# Patient Record
Sex: Female | Born: 1990 | Hispanic: No | Smoking: Never smoker
Health system: Southern US, Community
[De-identification: ages and names within clinical notes are randomized; demographics above are authoritative.]

## PROBLEM LIST (undated history)

## (undated) DIAGNOSIS — E282 Polycystic ovarian syndrome: Secondary | ICD-10-CM

## (undated) DIAGNOSIS — N200 Calculus of kidney: Secondary | ICD-10-CM

## (undated) HISTORY — PX: OTHER SURGICAL HISTORY: SHX169

## (undated) HISTORY — PX: APPENDECTOMY: SHX54

## (undated) HISTORY — DX: Polycystic ovarian syndrome: E28.2

## (undated) HISTORY — DX: Calculus of kidney: N20.0

## (undated) HISTORY — PX: RHINOPLASTY: SUR1284

---

## 2010-09-16 HISTORY — PX: APPENDECTOMY: SHX54

## 2017-07-11 ENCOUNTER — Emergency Department (INDEPENDENT_AMBULATORY_CARE_PROVIDER_SITE_OTHER)
Admission: EM | Admit: 2017-07-11 | Discharge: 2017-07-11 | Disposition: A | Discharge status: Home / Self Care | Payer: 59 | Source: Home / Self Care | Attending: Family Medicine | Admitting: Family Medicine

## 2017-07-11 ENCOUNTER — Encounter: Payer: Self-pay | Admitting: Emergency Medicine

## 2017-07-11 ENCOUNTER — Emergency Department (INDEPENDENT_AMBULATORY_CARE_PROVIDER_SITE_OTHER): Payer: 59

## 2017-07-11 DIAGNOSIS — R053 Chronic cough: Secondary | ICD-10-CM

## 2017-07-11 DIAGNOSIS — R05 Cough: Secondary | ICD-10-CM

## 2017-07-11 DIAGNOSIS — R0989 Other specified symptoms and signs involving the circulatory and respiratory systems: Secondary | ICD-10-CM

## 2017-07-11 DIAGNOSIS — J029 Acute pharyngitis, unspecified: Secondary | ICD-10-CM

## 2017-07-11 MED ORDER — BENZONATATE 100 MG PO CAPS: 100.0000 mg | ORAL_CAPSULE | Freq: Three times a day (TID) | ORAL | 0 refills | Status: DC

## 2017-07-11 MED ORDER — AZITHROMYCIN 250 MG PO TABS: 250.0000 mg | ORAL_TABLET | Freq: Every day | ORAL | 0 refills | Status: DC

## 2017-07-11 MED ORDER — IPRATROPIUM BROMIDE 0.06 % NA SOLN: 2.0000 | Freq: Four times a day (QID) | NASAL | 1 refills | Status: DC

## 2017-07-11 NOTE — ED Triage Notes (Signed)
Dry cough x 3 weeks, feels like something is stuck in my throat. Had fever of 103 for 2 days about 1 week ago.

## 2017-07-11 NOTE — ED Provider Notes (Signed)
[facility]    CSN: [id number] Arrival date & time: 07/11/17  0901     History   Chief Complaint Chief Complaint  Patient presents with  . Cough    HPI Angel [name] is a 26 y.o. female.   HPI  Angel [name] is a 26 y.o. female presenting to UC with c/o dry nagging cough that started about 3 weeks ago with fever Tmax 103*F the first week.  She reports several episodes of post-tussive vomiting of clear fluid.  She also reports foreign body sensation in her throat.  She has tried Dayquil/Nyquil w/o relief.  Denies fever this week.  Denies nausea or diarrhea. No known sick contacts. No hx of asthma.    History reviewed. No pertinent past medical history.  There are no active problems to display for this patient.   Past Surgical History:  Procedure Laterality Date  . APPENDECTOMY    . Kidney stone removal      OB History    No data available       Home Medications    Prior to Admission medications   Medication Sig Start Date End Date Taking? Authorizing Provider  Pseudoeph-Doxylamine-DM-APAP (DAYQUIL/NYQUIL COLD/FLU RELIEF PO) Take by mouth.   Yes [provider]  azithromycin (ZITHROMAX) 250 MG tablet Take 1 tablet (250 mg total) by mouth daily. Take first 2 tablets together, then 1 every day until finished. 07/11/17   Lurene Shadow, PA-C  benzonatate (TESSALON) 100 MG capsule Take 1-2 capsules (100-200 mg total) by mouth every 8 (eight) hours. 07/11/17   Lurene Shadow, PA-C  ipratropium (ATROVENT) 0.06 % nasal spray Place 2 sprays into both nostrils 4 (four) times daily. 07/11/17   Lurene Shadow, PA-C    Family History Family History  Problem Relation Age of Onset  . Kidney Stones Mother   . Kidney Stones Father     Social History Social History  Substance Use Topics  . Smoking status: Never Smoker  . Smokeless tobacco: Never Used  . Alcohol use No     Allergies   Levaquin [levofloxacin in d5w]   Review of Systems Review of  Systems  Constitutional: Negative for chills and fever.  HENT: Positive for congestion and sore throat. Negative for ear pain, trouble swallowing and voice change.   Respiratory: Positive for cough. Negative for shortness of breath.   Cardiovascular: Negative for chest pain and palpitations.  Gastrointestinal: Positive for vomiting ( post-tussive). Negative for abdominal pain, diarrhea and nausea.  Musculoskeletal: Negative for arthralgias, back pain and myalgias.  Skin: Negative for rash.  Neurological: Negative for dizziness, light-headedness and headaches.     Physical Exam Triage Vital Signs ED Triage Vitals  Enc Vitals Group     BP 07/11/17 0928 114/74     Pulse Rate 07/11/17 0928 98     Resp --      Temp 07/11/17 0928 98.3 F (36.8 C)     Temp Source 07/11/17 0928 Oral     SpO2 07/11/17 0928 100 %     Weight 07/11/17 0929 92 lb (41.7 kg)     Height 07/11/17 0929 5\' 1"  (1.549 m)     Head Circumference --      Peak Flow --      Pain Score 07/11/17 0929 0     Pain Loc --      Pain Edu? --      Excl. in GC? --    No data found.   Updated  Vital Signs BP 114/74 (BP Location: Left Arm)   Pulse 98   Temp 98.3 F (36.8 C) (Oral)   Ht 5\' 1"  (1.549 m)   Wt 92 lb (41.7 kg)   LMP 06/11/2017 (Exact Date)   SpO2 100%   BMI 17.38 kg/m   Visual Acuity Right Eye Distance:   Left Eye Distance:   Bilateral Distance:    Right Eye Near:   Left Eye Near:    Bilateral Near:     Physical Exam  Constitutional: She is oriented to person, place, and time. She appears well-developed and well-nourished. No distress.  HENT:  Head: Normocephalic and atraumatic.  Eyes: EOM are normal.  Neck: Normal range of motion. Neck supple.  Cardiovascular: Normal rate and regular rhythm.   Pulmonary/Chest: Effort normal and breath sounds normal. No stridor. No respiratory distress. She has no wheezes. She has no rales.  Musculoskeletal: Normal range of motion.  Lymphadenopathy:    She has  no cervical adenopathy.  Neurological: She is alert and oriented to person, place, and time.  Skin: Skin is warm and dry. She is not diaphoretic.  Psychiatric: She has a normal mood and affect. Her behavior is normal.  Nursing note and vitals reviewed.    UC Treatments / Results  Labs (all labs ordered are listed, but only abnormal results are displayed) Labs Reviewed - No data to display  EKG  EKG Interpretation None       Radiology Dg Neck Soft Tissue  Result Date: 07/11/2017 CLINICAL DATA:  Cough for 3 weeks.  Sore throat. EXAM: NECK SOFT TISSUES - 1+ VIEW COMPARISON:  None. FINDINGS: There is no evidence of retropharyngeal soft tissue swelling or epiglottic enlargement. The cervical airway is unremarkable and no radio-opaque foreign body identified. No acute osseous abnormality. IMPRESSION: Negative. Electronically Signed   By: Elige KoHetal  Patel   On: 07/11/2017 10:14   Dg Chest 2 View  Result Date: 07/11/2017 CLINICAL DATA:  Cough for 3 weeks EXAM: CHEST  2 VIEW COMPARISON:  None. FINDINGS: Lungs are clear. Heart size and pulmonary vascularity are normal. No adenopathy. No bone lesions. IMPRESSION: No edema or consolidation. Electronically Signed   By: Bretta BangWilliam  Woodruff III M.D.   On: 07/11/2017 10:14    Procedures Procedures (including critical care time)  Medications Ordered in UC Medications - No data to display   Initial Impression / Assessment and Plan / UC Course  I have reviewed the triage vital signs and the nursing notes.  Pertinent labs & imaging results that were available during my care of the patient were reviewed by me and considered in my medical decision making (see chart for details).     Pt c/o persistent non-productive cough that started with fever Tmax 103*F.  Vitals today: WNL including O2 Sat 100% on RA  Imaging: WNL Will cover for atypical bacteria with Azithromycin And treat symptomatically with ipratropium nasal spray and tessalon Throat  symptoms could be due to irritation from cough, post-nasal drip, and/or acid reflux.  F/u with PCP in 1 week if not improving.    Final Clinical Impressions(s) / UC Diagnoses   Final diagnoses:  Sensation of foreign body in throat  Persistent cough for 3 weeks or longer  Sore throat    New Prescriptions Discharge Medication List as of 07/11/2017 10:25 AM    START taking these medications   Details  azithromycin (ZITHROMAX) 250 MG tablet Take 1 tablet (250 mg total) by mouth daily. Take first 2 tablets together,  then 1 every day until finished., Starting Fri 07/11/2017, Normal    benzonatate (TESSALON) 100 MG capsule Take 1-2 capsules (100-200 mg total) by mouth every 8 (eight) hours., Starting Fri 07/11/2017, Normal    ipratropium (ATROVENT) 0.06 % nasal spray Place 2 sprays into both nostrils 4 (four) times daily., Starting Fri 07/11/2017, Normal         Controlled Substance Prescriptions [place] Controlled Substance Registry consulted? Not Applicable   Lurene Shadow, PA-C 07/11/17 1037

## 2019-09-17 HISTORY — PX: OTHER SURGICAL HISTORY: SHX169

## 2021-08-13 ENCOUNTER — Other Ambulatory Visit: Payer: Self-pay

## 2021-08-14 ENCOUNTER — Encounter: Payer: Self-pay | Admitting: Family Medicine

## 2021-08-14 ENCOUNTER — Ambulatory Visit (INDEPENDENT_AMBULATORY_CARE_PROVIDER_SITE_OTHER): Payer: 59 | Admitting: Family Medicine

## 2021-08-14 VITALS — BP 100/60 | HR 96 | Temp 98.0°F | Resp 16 | Ht 61.0 in | Wt 98.8 lb

## 2021-08-14 DIAGNOSIS — Z0000 Encounter for general adult medical examination without abnormal findings: Secondary | ICD-10-CM | POA: Diagnosis not present

## 2021-08-14 DIAGNOSIS — E282 Polycystic ovarian syndrome: Secondary | ICD-10-CM | POA: Diagnosis not present

## 2021-08-14 DIAGNOSIS — Z3A01 Less than 8 weeks gestation of pregnancy: Secondary | ICD-10-CM | POA: Diagnosis not present

## 2021-08-14 NOTE — Patient Instructions (Signed)
Eating Plan for Pregnant Women °While you are pregnant, your body requires additional nutrition to help support your growing baby. You also have a higher need for some vitamins and minerals, such as folic acid, calcium, iron, and vitamin D. Eating a healthy, well-balanced diet is very important for your health and your baby's health. Your need for extra calories varies over the course of your pregnancy. Pregnancy is divided into three trimesters, with each trimester lasting 3 months. For most women, it is recommended to consume: °150 extra calories a day during the first trimester. °300 extra calories a day during the second trimester. °300 extra calories a day during the third trimester. °What are tips for following this plan? °Cooking °Practice good food safety and cleanliness. Wash your hands before you eat and after you prepare raw meat. Wash all fruits and vegetables well before peeling or eating. Taking these actions can help to prevent foodborne illnesses that can be very dangerous to your baby, such as listeriosis. Ask your health care provider for more information about listeriosis. °Make sure that all meats, poultry, and eggs are cooked to food-safe temperatures or "well-done." °Meal planning ° °Eat a variety of foods (especially fruits and vegetables) to get a full range of vitamins and minerals. °Two or more servings of fish are recommended each week in order to get the most benefits from omega-3 fatty acids that are found in seafood. Choose fish that are lower in mercury, such as salmon and pollock. °Limit your overall intake of foods that have "empty calories." These are foods that have little nutritional value, such as sweets, desserts, candies, and sugar-sweetened beverages. °Drinks that contain caffeine are okay to drink, but it is better to avoid caffeine. Keep your total caffeine intake to less than 200 mg each day (which is 12 oz or 355 mL of coffee, tea, or soda) or the limit as told by your  health care provider. °General information °Do not try to lose weight or go on a diet during pregnancy. °Take a prenatal vitamin to help meet your additional vitamin and mineral needs during pregnancy, specifically for folic acid, iron, calcium, and vitamin D. °Remember to stay active. Ask your health care provider what types of exercise and activities are safe for you. °What does 150 extra calories look like? °Healthy options that provide 150 extra calories each day could be any of the following: °6-8 oz (170-227 g) plain low-fat yogurt with ½ cup (70 g) berries. °1 apple with 2 tsp (11 g) peanut butter. °Cut-up vegetables with ¼ cup (60 g) hummus. °8 fl oz (237 mL) low-fat chocolate milk. °1 stick of string cheese with 1 medium orange. °1 peanut butter and jelly sandwich that is made with one slice of whole-wheat bread and 1 tsp (5 g) of peanut butter. °For 300 extra calories, you could eat two of these healthy options each day. °What is a healthy amount of weight to gain? °The right amount of weight gain for you is based on your BMI (body mass index) before you became pregnant. °If your BMI was less than 18 (underweight), you should gain 28-40 lb (13-18 kg). °If your BMI was 18-24.9 (normal), you should gain 25-35 lb (11-16 kg). °If your BMI was 25-29.9 (overweight), you should gain 15-25 lb (7-11 kg). °If your BMI was 30 or greater (obese), you should gain 11-20 lb (5-9 kg). °What if I am having twins or multiples? °Generally, if you are carrying twins or multiples: °You may need to eat   300-600 extra calories a day. °The recommended range for total weight gain is 25-54 lb (11-25 kg), depending on your BMI before pregnancy. °Talk with your health care provider to find out about nutritional needs, weight gain, and exercise that is right for you. °What foods should I eat? °Fruits °All fruits. Eat a variety of colors and types of fruit. Remember to wash your fruits well before peeling or eating. °Vegetables °All  vegetables. Eat a variety of colors and types of vegetables. Remember to wash your vegetables well before peeling or eating. °Grains °All grains. Choose whole grains, such as whole-wheat bread, oatmeal, or brown rice. °Meats and other protein foods °Lean meats, including chicken, turkey, and lean cuts of beef, veal, or pork. Fish that is higher in omega-3 fatty acids and lower in mercury, such as salmon, herring, mussels, trout, sardines, pollock, shrimp, crab, and lobster. Tofu. Tempeh. Beans. Eggs. Peanut butter and other nut butters. °Dairy °Pasteurized milk and milk alternatives, such as almond milk. Pasteurized yogurt and pasteurized cheese. Cottage cheese. Sour cream. °Beverages °Water. Juices that contain 100% fruit juice or vegetable juice. Caffeine-free teas and decaffeinated coffee. °Fats and oils °Fats and oils are okay to include in moderation. °Sweets and desserts °Sweets and desserts are okay to include in moderation. °Seasoning and other foods °All pasteurized condiments. °The items listed above may not be a complete list of foods and beverages you can eat. Contact a dietitian for more information. °What foods should I avoid? °Fruits °Raw (unpasteurized) fruit juices. °Vegetables °Unpasteurized vegetable juices. °Meats and other protein foods °Precooked or cured meat, such as bologna, hot dogs, sausages, or meat loaves. (If you must eat those meats, reheat them until they are steaming hot.) Refrigerated pate, meat spreads from a meat counter, or smoked seafood that is found in the refrigerated section of a store. Raw or undercooked meats, poultry, and eggs. Raw fish, such as sushi or sashimi. Fish that have high mercury content, such as tilefish, shark, swordfish, and king mackerel. °Dairy °Unpasteurized milk and any foods that have unpasteurized milk in them. Soft cheeses, such as feta, queso blanco, queso fresco, Brie, Camembert, panela, and blue-veined cheeses (unless they are made with pasteurized  milk, which must be stated on the label). °Beverages °Alcohol. Sugar-sweetened beverages, such as sodas, teas, or energy drinks. °Seasoning and other foods °Homemade fermented foods and drinks, such as pickles, sauerkraut, or kombucha drinks. (Store-bought pasteurized versions of these are okay.) °Salads that are made in a store or deli, such as ham salad, chicken salad, egg salad, tuna salad, and seafood salad. °The items listed above may not be a complete list of foods and beverages you should avoid. Contact a dietitian for more information. °Where to find more information °To calculate the number of calories you need based on your height, weight, and activity level, you can use an online calculator such as: °www.myplate.gov/myplate-plan °To calculate how much weight you should gain during pregnancy, you can use an online pregnancy weight gain calculator such as: °www.myplate.gov °To learn more about eating fish during pregnancy, talk with your health care provider or visit: °www.fda.gov °Summary °While you are pregnant, your body requires additional nutrition to help support your growing baby. °Eat a variety of foods, especially fruits and vegetables, to get a full range of vitamins and minerals. °Practice good food safety and cleanliness. Wash your hands before you eat and after you prepare raw meat. Wash all fruits and vegetables well before peeling or eating. Taking these actions can help   to prevent foodborne illnesses, such as listeriosis, that can be very dangerous to your baby. °Do not eat raw meat or fish. Do not eat fish that have high mercury content, such as tilefish, shark, swordfish, and king mackerel. Do not eat raw (unpasteurized) dairy. °Take a prenatal vitamin to help meet your additional vitamin and mineral needs during pregnancy, specifically for folic acid, iron, calcium, and vitamin D. °This information is not intended to replace advice given to you by your health care provider. Make sure you  discuss any questions you have with your health care provider. °Document Revised: 03/30/2020 Document Reviewed: 03/30/2020 °Elsevier Patient Education © 2022 [employer] ° °

## 2021-08-14 NOTE — Progress Notes (Signed)
Established Patient Office Visit  Subjective:  Patient ID: Angel [name], female    DOB: Jan 21, 1991  Age: 30 y.o. MRN: [id number]  CC:  Chief Complaint  Patient presents with   New Patient (Initial Visit)    Pt states she is [redacted] weeks pregnant today. Pt states having hx of kidney stones and is concerned about her kidneys. Pt states first pregnancy.     HPI Angel [name] presents to establish.  She is 7 weeks present and would like a referral to ob.  No complaints  Her husband accompanies her today.    Past Medical History:  Diagnosis Date   Kidney stones    PCOS (polycystic ovarian syndrome)     Past Surgical History:  Procedure Laterality Date   APPENDECTOMY  2012   in [place]   Kidney stone removal  2021   and 2014    Family History  Problem Relation Age of Onset   Hypertension Mother    Kidney Stones Mother    Hypertension Father    Kidney Stones Father    Thyroid disease Sister    Seizures Sister    Polycystic ovary syndrome Sister     Social History   Socioeconomic History   Marital status: Married    Spouse name: Not on file   Number of children: Not on file   Years of education: Not on file   Highest education level: Not on file  Occupational History   Not on file  Tobacco Use   Smoking status: Never   Smokeless tobacco: Never  Substance and Sexual Activity   Alcohol use: No   Drug use: No   Sexual activity: Yes  Other Topics Concern   Not on file  Social History Narrative   Not on file   Social Determinants of Health   Financial Resource Strain: Not on file  Food Insecurity: Not on file  Transportation Needs: Not on file  Physical Activity: Not on file  Stress: Not on file  Social Connections: Not on file  Intimate Partner Violence: Not on file    Outpatient Medications Prior to Visit  Medication Sig Dispense Refill   Prenatal Vit-Fe Fumarate-FA (PRENATAL MULTIVITAMIN) TABS tablet Take 1 tablet by mouth daily at 12 noon.      azithromycin (ZITHROMAX) 250 MG tablet Take 1 tablet (250 mg total) by mouth daily. Take first 2 tablets together, then 1 every day until finished. 6 tablet 0   benzonatate (TESSALON) 100 MG capsule Take 1-2 capsules (100-200 mg total) by mouth every 8 (eight) hours. 21 capsule 0   ipratropium (ATROVENT) 0.06 % nasal spray Place 2 sprays into both nostrils 4 (four) times daily. (Patient not taking: Reported on 08/14/2021) 15 mL 1   Pseudoeph-Doxylamine-DM-APAP (DAYQUIL/NYQUIL COLD/FLU RELIEF PO) Take by mouth. (Patient not taking: Reported on 08/14/2021)     No facility-administered medications prior to visit.    Allergies  Allergen Reactions   Levaquin [Levofloxacin In D5w] Hives    ROS Review of Systems  Constitutional:  Negative for appetite change, diaphoresis, fatigue and unexpected weight change.  Eyes:  Negative for pain, redness and visual disturbance.  Respiratory:  Negative for cough, chest tightness, shortness of breath and wheezing.   Cardiovascular:  Negative for chest pain, palpitations and leg swelling.  Endocrine: Negative for cold intolerance, heat intolerance, polydipsia, polyphagia and polyuria.  Genitourinary:  Negative for difficulty urinating, dysuria and frequency.  Neurological:  Negative for dizziness, light-headedness, numbness and headaches.  Objective:    Physical Exam Vitals and nursing note reviewed.  Constitutional:      General: She is not in acute distress.    Appearance: She is well-developed. She is not diaphoretic.  HENT:     Head: Normocephalic and atraumatic.     Right Ear: External ear normal.     Left Ear: External ear normal.     Nose: Nose normal.  Eyes:     General:        Right eye: No discharge.        Left eye: No discharge.     Conjunctiva/sclera: Conjunctivae normal.     Pupils: Pupils are equal, round, and reactive to light.  Neck:     Thyroid: No thyromegaly.     Vascular: No JVD.  Cardiovascular:     Rate and Rhythm:  Normal rate and regular rhythm.     Heart sounds: Normal heart sounds. No murmur heard. Pulmonary:     Effort: Pulmonary effort is normal. No respiratory distress.     Breath sounds: Normal breath sounds. No wheezing or rales.  Chest:     Chest wall: No tenderness.  Abdominal:     General: Bowel sounds are normal. There is no distension.     Palpations: Abdomen is soft. There is no mass.     Tenderness: There is no abdominal tenderness. There is no guarding or rebound.  Genitourinary:    Vagina: Normal. No vaginal discharge.     Rectum: Guaiac result negative.  Musculoskeletal:        General: No tenderness. Normal range of motion.     Cervical back: Normal range of motion and neck supple.  Lymphadenopathy:     Cervical: No cervical adenopathy.  Skin:    General: Skin is warm and dry.     Findings: No erythema or rash.  Neurological:     Mental Status: She is alert and oriented to person, place, and time.     Cranial Nerves: No cranial nerve deficit.     Deep Tendon Reflexes: Reflexes are normal and symmetric.  Psychiatric:        Behavior: Behavior normal.        Thought Content: Thought content normal.        Judgment: Judgment normal.    BP 100/60 (BP Location: Left Arm, Patient Position: Sitting, Cuff Size: Normal)   Pulse 96   Temp 98 F (36.7 C) (Oral)   Resp 16   Ht 5\' 1"  (1.549 m)   Wt 98 lb 12.8 oz (44.8 kg)   LMP 06/26/2021   SpO2 100%   BMI 18.67 kg/m  Wt Readings from Last 3 Encounters:  08/14/21 98 lb 12.8 oz (44.8 kg)  07/11/17 92 lb (41.7 kg)     Health Maintenance Due  Topic Date Due   COVID-19 Vaccine (1) Never done   HIV Screening  Never done   Hepatitis C Screening  Never done   TETANUS/TDAP  Never done   PAP SMEAR-Modifier  Never done   INFLUENZA VACCINE  Never done    There are no preventive care reminders to display for this patient.  Lab Results  Component Value Date   TSH 2.20 08/14/2021   Lab Results  Component Value Date    WBC 7.3 08/14/2021   HGB 10.7 (L) 08/14/2021   HCT 32.8 (L) 08/14/2021   MCV 90.1 08/14/2021   PLT 225.0 08/14/2021   Lab Results  Component Value Date   NA  136 08/14/2021   K 4.4 08/14/2021   CO2 23 08/14/2021   GLUCOSE 72 08/14/2021   BUN 8 08/14/2021   CREATININE 0.48 08/14/2021   BILITOT 0.3 08/14/2021   ALKPHOS 45 08/14/2021   AST 19 08/14/2021   ALT 19 08/14/2021   PROT 6.9 08/14/2021   ALBUMIN 4.3 08/14/2021   CALCIUM 8.9 08/14/2021   GFR 126.26 08/14/2021   Lab Results  Component Value Date   CHOL 101 08/14/2021   Lab Results  Component Value Date   HDL 45.50 08/14/2021   Lab Results  Component Value Date   LDLCALC 43 08/14/2021   Lab Results  Component Value Date   TRIG 64.0 08/14/2021   Lab Results  Component Value Date   CHOLHDL 2 08/14/2021   No results found for: HGBA1C    Assessment & Plan:   Problem List Items Addressed This Visit       Unprioritized   Less than [redacted] weeks gestation of pregnancy    con't pnv ega [redacted] weeks today edc July 18,2023       Relevant Orders   Ambulatory referral to Obstetrics / Gynecology   Other Visit Diagnoses     Preventative health care    -  Primary   Relevant Orders   CBC with Differential/Platelet (Completed)   Lipid panel (Completed)   TSH (Completed)   Comprehensive metabolic panel (Completed)   Comprehensive metabolic panel   PCOS (polycystic ovarian syndrome)       Relevant Orders   Ambulatory referral to Obstetrics / Gynecology   Testos,Total,Free and SHBG (Female)       No orders of the defined types were placed in this encounter.   Follow-up: Return if symptoms worsen or fail to improve.    Ann Held, DO

## 2021-08-14 NOTE — Assessment & Plan Note (Signed)
con't pnv ega [redacted] weeks today edc July 18,2023

## 2021-08-15 LAB — COMPREHENSIVE METABOLIC PANEL
ALT: 19 U/L (ref 0–35)
AST: 19 U/L (ref 0–37)
Albumin: 4.3 g/dL (ref 3.5–5.2)
Alkaline Phosphatase: 45 U/L (ref 39–117)
BUN: 8 mg/dL (ref 6–23)
CO2: 23 mEq/L (ref 19–32)
Calcium: 8.9 mg/dL (ref 8.4–10.5)
Chloride: 105 mEq/L (ref 96–112)
Creatinine, Ser: 0.48 mg/dL (ref 0.40–1.20)
GFR: 126.26 mL/min (ref >60.00)
Glucose, Bld: 72 mg/dL (ref 70–99)
Potassium: 4.4 mEq/L (ref 3.5–5.1)
Sodium: 136 mEq/L (ref 135–145)
Total Bilirubin: 0.3 mg/dL (ref 0.2–1.2)
Total Protein: 6.9 g/dL (ref 6.0–8.3)

## 2021-08-15 LAB — CBC WITH DIFFERENTIAL/PLATELET
Basophils Absolute: 0 10*3/uL (ref 0.0–0.1)
Basophils Relative: 0.6 % (ref 0.0–3.0)
Eosinophils Absolute: 0.1 10*3/uL (ref 0.0–0.7)
Eosinophils Relative: 0.7 % (ref 0.0–5.0)
HCT: 32.8 % — ABNORMAL LOW (ref 36.0–46.0)
Hemoglobin: 10.7 g/dL — ABNORMAL LOW (ref 12.0–15.0)
Lymphocytes Relative: 22.8 % (ref 12.0–46.0)
Lymphs Abs: 1.7 10*3/uL (ref 0.7–4.0)
MCHC: 32.7 g/dL (ref 30.0–36.0)
MCV: 90.1 fl (ref 78.0–100.0)
Monocytes Absolute: 0.5 10*3/uL (ref 0.1–1.0)
Monocytes Relative: 6.7 % (ref 3.0–12.0)
Neutro Abs: 5.1 10*3/uL (ref 1.4–7.7)
Neutrophils Relative %: 69.2 % (ref 43.0–77.0)
Platelets: 225 10*3/uL (ref 150.0–400.0)
RBC: 3.64 Mil/uL — ABNORMAL LOW (ref 3.87–5.11)
RDW: 14.2 % (ref 11.5–15.5)
WBC: 7.3 10*3/uL (ref 4.0–10.5)

## 2021-08-15 LAB — LIPID PANEL
Cholesterol: 101 mg/dL (ref 0–200)
HDL: 45.5 mg/dL (ref >39.00)
LDL Cholesterol: 43 mg/dL (ref 0–99)
NonHDL: 55.61
Total CHOL/HDL Ratio: 2
Triglycerides: 64 mg/dL (ref 0.0–149.0)
VLDL: 12.8 mg/dL (ref 0.0–40.0)

## 2021-08-15 LAB — TSH: TSH: 2.2 u[IU]/mL (ref 0.35–5.50)

## 2021-08-16 ENCOUNTER — Other Ambulatory Visit: Payer: Self-pay | Admitting: Family Medicine

## 2021-08-16 ENCOUNTER — Ambulatory Visit (HOSPITAL_BASED_OUTPATIENT_CLINIC_OR_DEPARTMENT_OTHER)
Admission: RE | Admit: 2021-08-16 | Discharge: 2021-08-16 | Disposition: A | Discharge status: Home / Self Care | Payer: 59 | Source: Ambulatory Visit | Attending: Family Medicine | Admitting: Family Medicine

## 2021-08-16 ENCOUNTER — Other Ambulatory Visit: Payer: Self-pay

## 2021-08-16 DIAGNOSIS — Z0000 Encounter for general adult medical examination without abnormal findings: Secondary | ICD-10-CM | POA: Insufficient documentation

## 2021-08-16 DIAGNOSIS — Z3A01 Less than 8 weeks gestation of pregnancy: Secondary | ICD-10-CM | POA: Diagnosis present

## 2021-08-16 DIAGNOSIS — Z3491 Encounter for supervision of normal pregnancy, unspecified, first trimester: Secondary | ICD-10-CM | POA: Diagnosis not present

## 2021-08-16 NOTE — Assessment & Plan Note (Signed)
ghm utd Check labs see avs  

## 2021-08-19 LAB — TESTOS,TOTAL,FREE AND SHBG (FEMALE)
Free Testosterone: 2.5 pg/mL (ref 0.1–6.4)
Sex Hormone Binding: 149 nmol/L — ABNORMAL HIGH (ref 17–124)
Testosterone, Total, LC-MS-MS: 42 ng/dL (ref 2–45)

## 2021-08-20 ENCOUNTER — Encounter (HOSPITAL_COMMUNITY): Payer: Self-pay | Admitting: Family Medicine

## 2021-08-20 ENCOUNTER — Inpatient Hospital Stay (HOSPITAL_COMMUNITY)
Admission: AD | Admit: 2021-08-20 | Discharge: 2021-08-20 | Disposition: A | Discharge status: Home / Self Care | Payer: 59 | Attending: Family Medicine | Admitting: Family Medicine

## 2021-08-20 ENCOUNTER — Encounter: Payer: Self-pay | Admitting: Family Medicine

## 2021-08-20 ENCOUNTER — Other Ambulatory Visit: Payer: Self-pay

## 2021-08-20 DIAGNOSIS — O26891 Other specified pregnancy related conditions, first trimester: Secondary | ICD-10-CM | POA: Insufficient documentation

## 2021-08-20 DIAGNOSIS — Z3A01 Less than 8 weeks gestation of pregnancy: Secondary | ICD-10-CM | POA: Insufficient documentation

## 2021-08-20 DIAGNOSIS — O210 Mild hyperemesis gravidarum: Secondary | ICD-10-CM | POA: Diagnosis not present

## 2021-08-20 DIAGNOSIS — Z20822 Contact with and (suspected) exposure to covid-19: Secondary | ICD-10-CM | POA: Diagnosis not present

## 2021-08-20 DIAGNOSIS — R059 Cough, unspecified: Secondary | ICD-10-CM | POA: Insufficient documentation

## 2021-08-20 DIAGNOSIS — R109 Unspecified abdominal pain: Secondary | ICD-10-CM | POA: Diagnosis not present

## 2021-08-20 LAB — CBC
HCT: 35.6 % — ABNORMAL LOW (ref 36.0–46.0)
Hemoglobin: 11.8 g/dL — ABNORMAL LOW (ref 12.0–15.0)
MCH: 30.4 pg (ref 26.0–34.0)
MCHC: 33.1 g/dL (ref 30.0–36.0)
MCV: 91.8 fL (ref 80.0–100.0)
Platelets: 215 10*3/uL (ref 150–400)
RBC: 3.88 MIL/uL (ref 3.87–5.11)
RDW: 13.2 % (ref 11.5–15.5)
WBC: 6.7 10*3/uL (ref 4.0–10.5)
nRBC: 0 % (ref 0.0–0.2)

## 2021-08-20 LAB — COMPREHENSIVE METABOLIC PANEL
ALT: 20 U/L (ref 0–44)
AST: 23 U/L (ref 15–41)
Albumin: 4.5 g/dL (ref 3.5–5.0)
Alkaline Phosphatase: 60 U/L (ref 38–126)
Anion gap: 10 (ref 5–15)
BUN: 7 mg/dL (ref 6–20)
CO2: 19 mmol/L — ABNORMAL LOW (ref 22–32)
Calcium: 9.4 mg/dL (ref 8.9–10.3)
Chloride: 104 mmol/L (ref 98–111)
Creatinine, Ser: 0.61 mg/dL (ref 0.44–1.00)
GFR, Estimated: >60 mL/min (ref >60)
Glucose, Bld: 83 mg/dL (ref 70–99)
Potassium: 3.4 mmol/L — ABNORMAL LOW (ref 3.5–5.1)
Sodium: 133 mmol/L — ABNORMAL LOW (ref 135–145)
Total Bilirubin: 0.5 mg/dL (ref 0.3–1.2)
Total Protein: 7.9 g/dL (ref 6.5–8.1)

## 2021-08-20 LAB — RESP PANEL BY RT-PCR (FLU A&B, COVID) ARPGX2
Influenza A by PCR: POSITIVE — AB
Influenza B by PCR: NEGATIVE
SARS Coronavirus 2 by RT PCR: NEGATIVE

## 2021-08-20 LAB — URINALYSIS, ROUTINE W REFLEX MICROSCOPIC
Bilirubin Urine: NEGATIVE
Glucose, UA: NEGATIVE mg/dL
Hgb urine dipstick: NEGATIVE
Ketones, ur: >80 mg/dL — AB
Leukocytes,Ua: NEGATIVE
Nitrite: NEGATIVE
Protein, ur: NEGATIVE mg/dL
Specific Gravity, Urine: 1.02 (ref 1.005–1.030)
pH: 6.5 (ref 5.0–8.0)

## 2021-08-20 MED ORDER — LACTATED RINGERS IV BOLUS
1000.0000 mL | Freq: Once | INTRAVENOUS | Status: AC
  Administered 2021-08-20: 1000 mL via INTRAVENOUS

## 2021-08-20 MED ORDER — DOXYLAMINE-PYRIDOXINE 10-10 MG PO TBEC: 2.0000 | DELAYED_RELEASE_TABLET | Freq: Every day | ORAL | 5 refills | Status: DC

## 2021-08-20 MED ORDER — ONDANSETRON HCL 4 MG/2ML IJ SOLN
4.0000 mg | Freq: Once | INTRAMUSCULAR | Status: DC
  Filled 2021-08-20: qty 2

## 2021-08-20 MED ORDER — GUAIFENESIN-DM 100-10 MG/5ML PO SYRP: 5.0000 mL | ORAL_SOLUTION | ORAL | 0 refills | Status: DC | PRN

## 2021-08-20 MED ORDER — GUAIFENESIN-DM 100-10 MG/5ML PO SYRP
5.0000 mL | ORAL_SOLUTION | ORAL | Status: DC | PRN
  Administered 2021-08-20: 5 mL via ORAL
  Filled 2021-08-20 (×3): qty 5

## 2021-08-20 NOTE — MAU Note (Signed)
Since yesterday, having cough a whole lot, non-productive.  Yesterday had bleeding and her mouth- just a little bit, stopped after 2 min. Having nausea, was too much yesterday, no appetite at all.  Today was hungry, ordered pizza. Ate one slice, became sick.  Tried to rest, unable to sleep. Mild cramping in lower abd. Has not checked temperature.  Pt had [place] on 12/1,viable IUP @[redacted]w[redacted]d  with FHR of 144. (Pt was able to pull up the results)

## 2021-08-20 NOTE — MAU Provider Note (Signed)
History     CSN: [id number]  Arrival date and time: 08/20/21 1547   Event Date/Time   First Provider Initiated Contact with Patient 08/20/21 1701         Chief Complaint  Patient presents with   Nausea   Abdominal Pain   Emesis   Cough   HPI 30yo G1P0 at [redacted]w[redacted]d seen for nausea, body aches, cough that started yesterday. Symptoms have increased over night and appetite has gone completely away. She felt a little better, ate some pizza, then vomited again. No palliating or provoking factors. Had mild fever.  OB History     Gravida  1   Para      Term      Preterm      AB      Living         SAB      IAB      Ectopic      Multiple      Live Births              Past Medical History:  Diagnosis Date   Kidney stones    PCOS (polycystic ovarian syndrome)     Past Surgical History:  Procedure Laterality Date   APPENDECTOMY  2012   in [place]   Kidney stone removal  2021   and 2014    Family History  Problem Relation Age of Onset   Hypertension Mother    Kidney Stones Mother    Hypertension Father    Kidney Stones Father    Thyroid disease Sister    Seizures Sister    Polycystic ovary syndrome Sister     Social History   Tobacco Use   Smoking status: Never   Smokeless tobacco: Never  Substance Use Topics   Alcohol use: No   Drug use: No    Allergies:  Allergies  Allergen Reactions   Levaquin [Levofloxacin In D5w] Hives    Medications Prior to Admission  Medication Sig Dispense Refill Last Dose   Prenatal Vit-Fe Fumarate-FA (PRENATAL MULTIVITAMIN) TABS tablet Take 1 tablet by mouth daily at 12 noon.   08/20/2021    Review of Systems Physical Exam   Blood pressure 103/71, pulse (!) 108, temperature 99.8 F (37.7 C), temperature source Oral, resp. rate 16, height 5\' 1"  (1.549 m), weight 42.7 kg, last menstrual period 06/26/2021, SpO2 100 %.  Physical Exam Vitals and nursing note reviewed.  Constitutional:      Appearance: She  is well-developed.  Cardiovascular:     Rate and Rhythm: Normal rate and regular rhythm.  Pulmonary:     Effort: Pulmonary effort is normal.     Breath sounds: Normal breath sounds.  Abdominal:     General: Abdomen is flat. Bowel sounds are normal. There is no distension or abdominal bruit.     Palpations: Abdomen is soft.     Tenderness: There is no abdominal tenderness. There is no right CVA tenderness, left CVA tenderness, guarding or rebound.  Skin:    General: Skin is warm and dry.     Capillary Refill: Capillary refill takes less than 2 seconds.  Neurological:     Mental Status: She is alert.   Results for orders placed or performed during the hospital encounter of 08/20/21 (from the past 24 hour(s))  CBC     Status: Abnormal   Collection Time: 08/20/21  5:28 PM  Result Value Ref Range   WBC 6.7 4.0 - 10.5 K/uL  RBC 3.88 3.87 - 5.11 MIL/uL   Hemoglobin 11.8 (L) 12.0 - 15.0 g/dL   HCT 74.2 (L) 59.5 - 63.8 %   MCV 91.8 80.0 - 100.0 fL   MCH 30.4 26.0 - 34.0 pg   MCHC 33.1 30.0 - 36.0 g/dL   RDW 75.6 43.3 - 29.5 %   Platelets 215 150 - 400 K/uL   nRBC 0.0 0.0 - 0.2 %  Comprehensive metabolic panel     Status: Abnormal   Collection Time: 08/20/21  5:28 PM  Result Value Ref Range   Sodium 133 (L) 135 - 145 mmol/L   Potassium 3.4 (L) 3.5 - 5.1 mmol/L   Chloride 104 98 - 111 mmol/L   CO2 19 (L) 22 - 32 mmol/L   Glucose, Bld 83 70 - 99 mg/dL   BUN 7 6 - 20 mg/dL   Creatinine, Ser 1.88 0.44 - 1.00 mg/dL   Calcium 9.4 8.9 - 41.6 mg/dL   Total Protein 7.9 6.5 - 8.1 g/dL   Albumin 4.5 3.5 - 5.0 g/dL   AST 23 15 - 41 U/L   ALT 20 0 - 44 U/L   Alkaline Phosphatase 60 38 - 126 U/L   Total Bilirubin 0.5 0.3 - 1.2 mg/dL   GFR, Estimated >60 >63 mL/min   Anion gap 10 5 - 15  Urinalysis, Routine w reflex microscopic Urine, Clean Catch     Status: Abnormal   Collection Time: 08/20/21  5:34 PM  Result Value Ref Range   Color, Urine YELLOW YELLOW   APPearance CLEAR CLEAR    Specific Gravity, Urine 1.020 1.005 - 1.030   pH 6.5 5.0 - 8.0   Glucose, UA NEGATIVE NEGATIVE mg/dL   Hgb urine dipstick NEGATIVE NEGATIVE   Bilirubin Urine NEGATIVE NEGATIVE   Ketones, ur >80 (A) NEGATIVE mg/dL   Protein, ur NEGATIVE NEGATIVE mg/dL   Nitrite NEGATIVE NEGATIVE   Leukocytes,Ua NEGATIVE NEGATIVE     MAU Course  Procedures  MDM S/p IVF, robitussin for cough  Assessment and Plan   1. [redacted] weeks gestation of pregnancy   2. Morning sickness    Discharge to home.  COVID/Flu still pending Antiemetic sent to pharmacy.  Continue OTC treatment for cough.  Levie Heritage 08/20/2021, 5:01 PM

## 2021-09-06 ENCOUNTER — Encounter: Payer: Self-pay | Admitting: Family Medicine

## 2021-09-18 ENCOUNTER — Other Ambulatory Visit: Payer: Self-pay

## 2021-09-18 DIAGNOSIS — O219 Vomiting of pregnancy, unspecified: Secondary | ICD-10-CM

## 2021-09-18 MED ORDER — PROMETHAZINE HCL 25 MG PO TABS: 25.0000 mg | ORAL_TABLET | Freq: Four times a day (QID) | ORAL | 0 refills | Status: DC | PRN

## 2021-09-20 ENCOUNTER — Other Ambulatory Visit: Payer: Self-pay

## 2021-09-20 ENCOUNTER — Ambulatory Visit (INDEPENDENT_AMBULATORY_CARE_PROVIDER_SITE_OTHER): Payer: 59 | Admitting: Family Medicine

## 2021-09-20 ENCOUNTER — Other Ambulatory Visit (HOSPITAL_COMMUNITY)
Admission: RE | Admit: 2021-09-20 | Discharge: 2021-09-20 | Disposition: A | Discharge status: Home / Self Care | Payer: 59 | Source: Ambulatory Visit | Attending: Family Medicine | Admitting: Family Medicine

## 2021-09-20 DIAGNOSIS — Z3401 Encounter for supervision of normal first pregnancy, first trimester: Secondary | ICD-10-CM

## 2021-09-20 DIAGNOSIS — Z3400 Encounter for supervision of normal first pregnancy, unspecified trimester: Secondary | ICD-10-CM | POA: Diagnosis present

## 2021-09-20 DIAGNOSIS — Z3A12 12 weeks gestation of pregnancy: Secondary | ICD-10-CM

## 2021-09-20 MED ORDER — ONDANSETRON 4 MG PO TBDP: 4.0000 mg | ORAL_TABLET | Freq: Four times a day (QID) | ORAL | 0 refills | Status: DC | PRN

## 2021-09-20 NOTE — Progress Notes (Signed)
Subjective:  Angel [name] is a G1P0 [redacted]w[redacted]d being seen today for her first obstetrical visit.  Her obstetrical history is significant for  first pregnancy. Having hyperemesis and fatigue. Eating some fruits and juices. Lentils are ok . Planned pregnancy. FOB is patient's spouse. Patient does intend to breast feed. Pregnancy history fully reviewed.  Patient reports nausea and vomiting.  BP 102/69    Pulse 97    Wt 95 lb (43.1 kg)    LMP 06/26/2021    BMI 17.95 kg/m   HISTORY: OB History  Gravida Para Term Preterm AB Living  1            SAB IAB Ectopic Multiple Live Births               # Outcome Date GA Lbr Len/2nd Weight Sex Delivery Anes PTL Lv  1 Current             Past Medical History:  Diagnosis Date   Kidney stones    PCOS (polycystic ovarian syndrome)     Past Surgical History:  Procedure Laterality Date   APPENDECTOMY  2012   in [place]   Kidney stone removal  2021   and 2014    Family History  Problem Relation Age of Onset   Hypertension Mother    Kidney Stones Mother    Hypertension Father    Kidney Stones Father    Thyroid disease Sister    Seizures Sister    Polycystic ovary syndrome Sister      Exam  BP 102/69    Pulse 97    Wt 95 lb (43.1 kg)    LMP 06/26/2021    BMI 17.95 kg/m   Chaperone present during exam  CONSTITUTIONAL: Well-developed, well-nourished female in no acute distress.  HENT:  Normocephalic, atraumatic, External right and left ear normal. Oropharynx is clear and moist EYES: Conjunctivae and EOM are normal. Pupils are equal, round, and reactive to light. No scleral icterus.  NECK: Normal range of motion, supple, no masses.  Normal thyroid.  CARDIOVASCULAR: Normal heart rate noted, regular rhythm RESPIRATORY: Clear to auscultation bilaterally. Effort and breath sounds normal, no problems with respiration noted. BREASTS: Symmetric in size. No masses, skin changes, nipple drainage, or lymphadenopathy. ABDOMEN: Soft, normal bowel  sounds, no distention noted.  No tenderness, rebound or guarding.  PELVIC: Normal appearing external genitalia; normal appearing vaginal mucosa and cervix. No abnormal discharge noted. Normal uterine size, no other palpable masses, no uterine or adnexal tenderness. MUSCULOSKELETAL: Normal range of motion. No tenderness.  No cyanosis, clubbing, or edema.  2+ distal pulses. SKIN: Skin is warm and dry. No rash noted. Not diaphoretic. No erythema. No pallor. NEUROLOGIC: Alert and oriented to person, place, and time. Normal reflexes, muscle tone coordination. No cranial nerve deficit noted. PSYCHIATRIC: Normal mood and affect. Normal behavior. Normal judgment and thought content.    Assessment:    Pregnancy: G1P0 Patient Active Problem List   Diagnosis Date Noted   Supervision of normal first pregnancy, antepartum 09/20/2021   Preventative health care 08/16/2021   Less than [redacted] weeks gestation of pregnancy 08/14/2021      Plan:   1. Supervision of normal first pregnancy, antepartum - CBC/D/Plt+RPR+Rh+ABO+RubIgG... - Cytology - PAP( [place]) - Culture, OB Urine - [place] MFM OB COMP + 14 WK; Future - Flu Vaccine QUAD 36+ mos IM (Fluarix, Quad PF) - [facility] AMB BABYSCRIPTS OPT IN - SMN1 COPY NUMBER ANALYSIS ([facility] Carrier Screen)    Initial labs  obtained Continue prenatal vitamins Reviewed n/v relief measures and warning s/s to report Reviewed recommended weight gain based on pre-gravid BMI Encouraged well-balanced diet Genetic & carrier screening discussed: requests Panorama,  Ultrasound discussed; fetal survey: requested [place] completed> form faxed if has or is planning to apply for medicaid The nature of [place] for [employer] with multiple MDs and other [place] Providers was explained to patient; also emphasized that fellows, residents, and students are part of our team.  Problem list reviewed and updated. 75% of 30 min visit spent on counseling and  coordination of care.     Truett Mainland 09/20/2021

## 2021-09-21 LAB — CBC/D/PLT+RPR+RH+ABO+RUBIGG...
Antibody Screen: NEGATIVE
Basophils Absolute: 0 10*3/uL (ref 0.0–0.2)
Basos: 0 %
EOS (ABSOLUTE): 0.1 10*3/uL (ref 0.0–0.4)
Eos: 1 %
HCV Ab: <0.1 s/co ratio (ref 0.0–0.9)
HIV Screen 4th Generation wRfx: NONREACTIVE
Hematocrit: 32.8 % — ABNORMAL LOW (ref 34.0–46.6)
Hemoglobin: 11.1 g/dL (ref 11.1–15.9)
Hepatitis B Surface Ag: NEGATIVE
Immature Grans (Abs): 0 10*3/uL (ref 0.0–0.1)
Immature Granulocytes: 0 %
Lymphocytes Absolute: 2.1 10*3/uL (ref 0.7–3.1)
Lymphs: 23 %
MCH: 30.3 pg (ref 26.6–33.0)
MCHC: 33.8 g/dL (ref 31.5–35.7)
MCV: 90 fL (ref 79–97)
Monocytes Absolute: 0.5 10*3/uL (ref 0.1–0.9)
Monocytes: 6 %
Neutrophils Absolute: 6.1 10*3/uL (ref 1.4–7.0)
Neutrophils: 70 %
Platelets: 230 10*3/uL (ref 150–450)
RBC: 3.66 x10E6/uL — ABNORMAL LOW (ref 3.77–5.28)
RDW: 13 % (ref 11.7–15.4)
RPR Ser Ql: NONREACTIVE
Rh Factor: POSITIVE
Rubella Antibodies, IGG: 11.3 index (ref >0.99)
WBC: 8.8 10*3/uL (ref 3.4–10.8)

## 2021-09-21 LAB — HCV INTERPRETATION

## 2021-09-22 LAB — URINE CULTURE, OB REFLEX

## 2021-09-22 LAB — CULTURE, OB URINE

## 2021-09-25 LAB — CYTOLOGY - PAP
Chlamydia: NEGATIVE
Comment: NEGATIVE
Comment: NEGATIVE
Comment: NORMAL
High risk HPV: POSITIVE — AB
Neisseria Gonorrhea: NEGATIVE

## 2021-10-19 ENCOUNTER — Encounter: Payer: 59 | Admitting: Family Medicine

## 2021-11-05 ENCOUNTER — Ambulatory Visit: Payer: 59 | Attending: Family Medicine

## 2021-11-05 ENCOUNTER — Other Ambulatory Visit: Payer: Self-pay

## 2021-11-05 DIAGNOSIS — Z3A18 18 weeks gestation of pregnancy: Secondary | ICD-10-CM | POA: Insufficient documentation

## 2021-11-05 DIAGNOSIS — Z3689 Encounter for other specified antenatal screening: Secondary | ICD-10-CM | POA: Insufficient documentation

## 2021-11-05 DIAGNOSIS — Z363 Encounter for antenatal screening for malformations: Secondary | ICD-10-CM | POA: Diagnosis not present

## 2021-11-05 DIAGNOSIS — Z3400 Encounter for supervision of normal first pregnancy, unspecified trimester: Secondary | ICD-10-CM

## 2021-11-12 ENCOUNTER — Encounter: Payer: Self-pay | Admitting: Obstetrics and Gynecology

## 2021-11-12 ENCOUNTER — Other Ambulatory Visit: Payer: Self-pay

## 2021-11-12 ENCOUNTER — Ambulatory Visit (INDEPENDENT_AMBULATORY_CARE_PROVIDER_SITE_OTHER): Payer: 59 | Admitting: Obstetrics and Gynecology

## 2021-11-12 VITALS — BP 106/54 | HR 97 | Wt 100.0 lb

## 2021-11-12 DIAGNOSIS — Z3400 Encounter for supervision of normal first pregnancy, unspecified trimester: Secondary | ICD-10-CM

## 2021-11-12 DIAGNOSIS — Z3A19 19 weeks gestation of pregnancy: Secondary | ICD-10-CM

## 2021-11-12 DIAGNOSIS — Z23 Encounter for immunization: Secondary | ICD-10-CM

## 2021-11-12 DIAGNOSIS — Z3402 Encounter for supervision of normal first pregnancy, second trimester: Secondary | ICD-10-CM

## 2021-11-12 DIAGNOSIS — R87612 Low grade squamous intraepithelial lesion on cytologic smear of cervix (LGSIL): Secondary | ICD-10-CM

## 2021-11-12 NOTE — Progress Notes (Signed)
° °  PRENATAL VISIT NOTE  Subjective:  Angel [name] is a 31 y.o. G1P0 at [redacted]w[redacted]d being seen today for ongoing prenatal care.  She is currently monitored for the following issues for this low-risk pregnancy and has Preventative health care and Supervision of normal first pregnancy, antepartum on their problem list.  Patient reports no complaints except growth on her nipple and some pain with intercourse.  Contractions: Not present. Vag. Bleeding: None.  Movement: Present. Denies leaking of fluid.   The following portions of the patient's history were reviewed and updated as appropriate: allergies, current medications, past family history, past medical history, past social history, past surgical history and problem list.   Objective:   Vitals:   11/12/21 0926  BP: (!) 106/54  Pulse: 97  Weight: 100 lb (45.4 kg)    Fetal Status: Fetal Heart Rate (bpm): 150   Movement: Present     General:  Alert, oriented and cooperative. Patient is in no acute distress.  Skin: Skin is warm and dry. No rash noted.   Cardiovascular: Normal heart rate noted  Respiratory: Normal respiratory effort, no problems with respiration noted  Abdomen: Soft, gravid, appropriate for gestational age.  Pain/Pressure: Present     Pelvic: Cervical exam performed in the presence of a chaperone        Extremities: Normal range of motion.  Edema: None  Mental Status: Normal mood and affect. Normal behavior. Normal judgment and thought content.  Breast: small 1-58mm skin tag  OFFICE COLPOSCOPY PROCEDURE NOTE  31 y.o. G1P0 here for colposcopy for low-grade squamous intraepithelial neoplasia (LGSIL - encompassing HPV,mild dysplasia,CIN I) pap smear on 09/20/21.   Pregnancy test:  not indicated  Informed consent and review of risks, benefit and alternatives performed. Written consent given.   Speculum inserted into patient's vagina assuring full view of cervix and vaginal walls. 3 swabs of vinegar solution applied to the cervix  and vaginal walls and colposcope was used to observe both the cervix and vaginal walls.   Colposcopy adequate? Yes  no mosaicism, no punctation, no abnormal vasculature, and acetowhite lesion(s) noted at 12 o'clock that was thin and at the SCJ, geographic borders with the AWE fading quickly most c/w CIN1  Speculum removed.  Pt tolerated well with minimal pain and bleeding.   Patient was given post procedure instructions.  Will follow up pathology and manage accordingly; patient will be contacted with results and recommendations.  Routine preventative health maintenance measures emphasized.  Assessment and Plan:  Pregnancy: G1P0 at [redacted]w[redacted]d 1. Supervision of normal first pregnancy, antepartum - MSAFP offered and recommended - pt accepts - Offered and recommended flu shot - pt aceepts - Anatomy normal but recommended to f/u 4 weeks from now as growth was 12%ile - discussed with pt and spouse - Discussed silicone based lubricant - review benign skin tag  2. Low grade squamous intraepithelial lesion on cytologic smear of cervix (LGSIL) - Colpo done today, recommend rechecking pap postpartum  Preterm labor symptoms and general obstetric precautions including but not limited to vaginal bleeding, contractions, leaking of fluid and fetal movement were reviewed in detail with the patient. Please refer to After Visit Summary for other counseling recommendations.   No follow-ups on file.  Future Appointments  Date Time Provider Department Center  12/11/2021 10:55 AM Aviva Signs, CNM [facility] None    Milas Hock, MD

## 2021-11-14 ENCOUNTER — Telehealth: Payer: Self-pay | Admitting: Family Medicine

## 2021-11-14 ENCOUNTER — Other Ambulatory Visit: Payer: Self-pay

## 2021-11-14 ENCOUNTER — Ambulatory Visit: Payer: 59 | Attending: Obstetrics and Gynecology

## 2021-11-14 ENCOUNTER — Ambulatory Visit: Payer: Self-pay | Admitting: Genetics

## 2021-11-14 DIAGNOSIS — O285 Abnormal chromosomal and genetic finding on antenatal screening of mother: Secondary | ICD-10-CM

## 2021-11-14 LAB — AFP TETRA
DIA Mom Value: 1.45
DIA Value (EIA): 335.97 pg/mL
DSR (By Age)    1 IN: 535
DSR (Second Trimester) 1 IN: 251
Gestational Age: 19.7 WEEKS
MSAFP Mom: 1.28
MSAFP: 96.8 ng/mL
MSHCG Mom: 2.05
MSHCG: 74419 m[IU]/mL
Maternal Age At EDD: 31.9 yr
Osb Risk: 5096
T18 (By Age): 1:2086 {titer}
Test Results:: POSITIVE — AB
Weight: 100 [lb_av]
uE3 Mom: 0.52
uE3 Value: 1.3 ng/mL

## 2021-11-14 NOTE — Telephone Encounter (Signed)
Reviewed results of AFP tetra, which had been ordered by mistake. The results show an elevated DSR. The patient also has a Panorama NIPS test, which indicated LR of Down's Syndrome. I discussed with patient, who requested a call back when her husband was available. I also discussed with Dr Judeth Cornfield, who forwarded this to the the genetic counselor. As the patient is flying out to [place] tomorrow, an appointment was made with Dentist today.  ?

## 2021-11-15 ENCOUNTER — Encounter: Payer: 59 | Admitting: Family Medicine

## 2021-11-15 ENCOUNTER — Ambulatory Visit: Payer: 59

## 2021-11-15 NOTE — Progress Notes (Signed)
?Name: Angel [name] Indication: Discuss risk for aneuploidy  ?DOB: March 01, 1991 Age: 31 y.o.   ?EDC: 04/02/2022 LMP: 06/26/2021 Referring Provider:  ?Levie Heritage, DO  ?EGA: [redacted]w[redacted]d Genetic Counselor: ?Teena Dunk, MS, CGC  ?OB Hx: G1P0 Date of Appointment: 11/14/2021  ?Accompanied by:  ?Father of the pregnancy, Angel [name]  ?Genetic counseling student Face to Face Time: 60 Minutes  ? ?Medical History:  ?Reports she takes prenatal vitamins. ?Denies personal history of diabetes, high blood pressure, thyroid conditions, and seizures. ?Denies bleeding, infections, and fevers in this pregnancy. ?Denies using tobacco, alcohol, or street drugs in this pregnancy.  ? ?Family History: A pedigree was created and scanned into Epic under the Media tab. ?Angel [name]'s 80 year old sister was born with spina bifida and bilateral club feet. This sister has a healthy 44 year old daughter. ?Angel [name] reports his parents are first cousins to each other. Angel [name] and his 13 year old brother are healthy.  ?Maternal ethnicity reported as [place] and paternal ethnicity reported as [place].  Denies Ashkenazi Jewish ancestry.  ?   ?Genetic Counseling:  ? ?Risk for Aneuploidy. Angel [name] had her blood drawn for Non-Invasive Prenatal Screening (NIPS) on 09/20/2021. On 09/29/2021 the NIPS resulted low risk. This significantly reduced the chance that the current pregnancy has Down syndrome, Trisomy 28, Trisomy 39, and common sex chromosome conditions. The new chance for these conditions after the NIPS is approximately 1 in 10,000. [name] had her blood drawn for a quad screen on 11/12/2021. On 11/14/2021 the quad screen resulted high risk for the current pregnancy to have Down syndrome (1 in 251 chance). Genetic counseling compared the technology and detection rates of NIPS to quad screening. We reviewed that the NIPS has a higher sensitivity and specificity compared to the quad screen regarding Down syndrome. The risk for Down syndrome is most likely 1 in 10,000 as  reported on the NIPS result, however, given that NIPS is not diagnostic amniocentesis could be considered for prenatal diagnosis. [name] declined amniocentesis at this time given that she departs for a vacation to [place] tomorrow (11/15/2021). Amniocentesis remains available for [name] should she want prenatal diagnosis in the future.  ? ?Birth Defects. [name]'s sister was born with spina bifida and bilateral club feet. We briefly reviewed that [name]'s anatomy ultrasound performed on 11/05/2021 at [redacted]w[redacted]d gestation did not detect any structural defects. Genetic counseling did not discuss spina bifida/club feet further as the patient's main concern was the high risk quad screen result described above. All babies have approximately a 3-5% risk for a birth defect and a majority of these defects cannot be detected through the screening or diagnostic testing listed below. Ultrasound is a screening tool that may detect some birth defects, but it may not detect all birth defects. About half of pregnancies with Down syndrome do not show any soft markers on ultrasound. A normal ultrasound does not guarantee a healthy pregnancy. ?  ? ?Testing/Screening Options:  ? ?Amniocentesis. This procedure is available for prenatal diagnosis. Possible procedural difficulties and complications that can arise include maternal infection, cramping, bleeding, fluid leakage, and/or pregnancy loss. The risk for pregnancy loss with an amniocentesis is 1/500. Per the [employer] of [employer]st[employer]ians [employer] Gynecologists (ACOG) Practice Bulletin 162, all pregnant women should be offered prenatal assessment for aneuploidy by diagnostic testing regardless of maternal age or other risk factors. If indicated, genetic testing that could be ordered on an amniocentesis sample includes a fetal karyotype, fetal microarray, and testing for specific syndromes. ?  ?  ?Patient  Plan: ? ?Proceed with: Routine prenatal care ?Informed consent was obtained. All questions  were answered. ? ?Declined: Amniocentesis for prenatal diagnosis.   ? ?Thank you for sharing in the care of [name] with [place].  ?Please do not hesitate to contact us if you have any questions. ? ?Teena Dunk, MS, CGC ?Certified Genetic Counselor ?

## 2021-11-19 ENCOUNTER — Encounter: Payer: Self-pay | Admitting: General Practice

## 2021-12-11 ENCOUNTER — Encounter: Payer: Self-pay | Admitting: Advanced Practice Midwife

## 2021-12-11 ENCOUNTER — Ambulatory Visit (INDEPENDENT_AMBULATORY_CARE_PROVIDER_SITE_OTHER): Payer: 59 | Admitting: Advanced Practice Midwife

## 2021-12-11 ENCOUNTER — Other Ambulatory Visit: Payer: Self-pay

## 2021-12-11 VITALS — BP 94/70 | HR 105 | Wt 101.0 lb

## 2021-12-11 DIAGNOSIS — R772 Abnormality of alphafetoprotein: Secondary | ICD-10-CM

## 2021-12-11 DIAGNOSIS — L918 Other hypertrophic disorders of the skin: Secondary | ICD-10-CM

## 2021-12-11 DIAGNOSIS — Z3480 Encounter for supervision of other normal pregnancy, unspecified trimester: Secondary | ICD-10-CM

## 2021-12-11 NOTE — Progress Notes (Signed)
Skin t ? ?PRENATAL VISIT NOTE ? ?Subjective:  ?Angel [name] is a 31 y.o. G1P0 at [redacted]w[redacted]d being seen today for ongoing prenatal care.  She is currently monitored for the following issues for this low-risk pregnancy and has Preventative health care and Supervision of normal first pregnancy, antepartum on their problem list. ? ?Patient reports  went to genetic counseling due to abnormal Triple test, but was told since Panorama was normal all is well.   States skin tag on right breast fell off, no further problems there.  .  Contractions: Not present. Vag. Bleeding: None.  Movement: Present. Denies leaking of fluid.  ? ?The following portions of the patient's history were reviewed and updated as appropriate: allergies, current medications, past family history, past medical history, past social history, past surgical history and problem list.  ? ?Objective:  ? ?Vitals:  ? 12/11/21 1057  ?BP: 94/70  ?Pulse: (!) 105  ?Weight: 101 lb (45.8 kg)  ? ? ?Fetal Status: Fetal Heart Rate (bpm): 153   Movement: Present    ? ?General:  Alert, oriented and cooperative. Patient is in no acute distress.  ?Skin: Skin is warm and dry. No rash noted.   ?Cardiovascular: Normal heart rate noted  ?Respiratory: Normal respiratory effort, no problems with respiration noted  ?Abdomen: Soft, gravid, appropriate for gestational age.  Pain/Pressure: Present     ?Pelvic: Cervical exam deferred        ?Extremities: Normal range of motion.  Edema: Trace  ?Mental Status: Normal mood and affect. Normal behavior. Normal judgment and thought content.  ? ?Assessment and Plan:  ?Pregnancy: G1P0 at [redacted]w[redacted]d ?1. Skin tag ?    Resolved ? ?2. Abnormal AFP3 test ?    Panorama normal  ?    Went to Dentist, states is normal  ? ?Preterm labor symptoms and general obstetric precautions including but not limited to vaginal bleeding, contractions, leaking of fluid and fetal movement were reviewed in detail with the patient. ?Please refer to After Visit Summary  for other counseling recommendations.  ? ?Return in about 3 weeks (around 01/01/2022) for [facility]. ? ?Future Appointments  ?Date Time Provider Department Center  ?12/14/2021 10:45 AM [facility] US6 [facility]  ?01/07/2022  8:35 AM Willodean Rosenthal, MD [facility] None  ?01/22/2022  9:35 AM Aviva Signs, CNM [facility] None  ?02/05/2022  8:55 AM Aviva Signs, CNM [facility] None  ?02/19/2022  8:55 AM Gerrit Heck, CNM [facility] None  ?03/05/2022  8:55 AM Aviva Signs, CNM [facility] None  ?03/12/2022  8:55 AM Aviva Signs, CNM [facility] None  ? ? ?Wynelle Bourgeois, CNM ?

## 2021-12-14 ENCOUNTER — Ambulatory Visit: Payer: 59

## 2021-12-14 ENCOUNTER — Other Ambulatory Visit: Payer: Self-pay | Admitting: Obstetrics and Gynecology

## 2021-12-14 ENCOUNTER — Ambulatory Visit (HOSPITAL_BASED_OUTPATIENT_CLINIC_OR_DEPARTMENT_OTHER): Payer: 59 | Admitting: Obstetrics and Gynecology

## 2021-12-14 ENCOUNTER — Encounter: Payer: 59 | Admitting: Family Medicine

## 2021-12-14 ENCOUNTER — Ambulatory Visit: Payer: 59 | Attending: Obstetrics and Gynecology

## 2021-12-14 DIAGNOSIS — O365920 Maternal care for other known or suspected poor fetal growth, second trimester, not applicable or unspecified: Secondary | ICD-10-CM | POA: Diagnosis not present

## 2021-12-14 DIAGNOSIS — O365932 Maternal care for other known or suspected poor fetal growth, third trimester, fetus 2: Secondary | ICD-10-CM

## 2021-12-14 DIAGNOSIS — Z362 Encounter for other antenatal screening follow-up: Secondary | ICD-10-CM | POA: Diagnosis present

## 2021-12-14 DIAGNOSIS — Z3A24 24 weeks gestation of pregnancy: Secondary | ICD-10-CM | POA: Diagnosis not present

## 2021-12-14 DIAGNOSIS — Z3400 Encounter for supervision of normal first pregnancy, unspecified trimester: Secondary | ICD-10-CM | POA: Diagnosis not present

## 2021-12-14 NOTE — Progress Notes (Signed)
Maternal-Fetal Medicine  ? ?Name: Valarie Bolivar ?DOB: [birth year] ?MRN: [id number] ?Referring Provider: Radene Gunning, MD ? ?I had the pleasure of seeing Ms. Dimeglio today at the [place] [facility]. She is G1 P0 at 24w 3d gestation and is here for fetal growth assessment.  On previous ultrasound, the estimated fetal weight was at the 12 percentile. ? ?On cell-free fetal DNA screening, the risks of fetal aneuploidies are not increased.  After her cell free fetal DNA screening, patient had tetra screen that showed increased risk for Down syndrome.  MSAFP screening showed low risk for open neural tube defects. ?The couple had consultation with our genetic counselor and opted not to have amniocentesis. ? ?On today's ultrasound, amniotic fluid is normal and good fetal activity seen.  The estimated fetal weight is at the 4th percentile.  Abdominal circumference measurement is at the 9th percentile.  Femur length measurement is at the 4th percentile.  Umbilical artery Doppler showed normal forward diastolic flow. ? ?No obvious fetal structural defects are seen.  Cardiac anatomy appears normal. ? ?Fetal growth restriction ?I explained the finding of fetal growth restriction that is difficult to differentiate from a constitutionally small for gestational age fetus. ?I discussed the possible causes of fetal growth restriction including placental insufficiency (most common cause), chromosomal anomalies and fetal infections.  Patient did not give history of fever or rashes. ?I explained that only amniocentesis will give a definitive result on the fetal karyotype and some genetic conditions ([employer]).  I explained amniocentesis procedure and possible complication of preterm delivery (1 and 500 procedures).  Today, the patient opted not to have amniocentesis.  The couple will, however, discuss at home. ?I discussed ultrasound protocol of monitoring fetal growth restriction. ?Timing of delivery: Since small for  gestational age fetuses have a higher chance of having perinatal mortality and morbidity, delivery at 38 to [redacted] weeks gestation is reasonable.  If, however, severe fetal growth restriction is seen with normal antenatal testing, we will recommend delivery at [redacted] weeks gestation. ?We will discuss timing of delivery later in gestation. ? ?Recommendations ?-An appointment was made for her to return in 2 weeks for umbilical artery Doppler studies. ?-Fetal growth assessment in 3 weeks. ? ?Thank you for consultation.  If you have any questions or concerns, please contact me the [facility].  Consultation including face-to-face (more than 50%) counseling 30 minutes. ?

## 2021-12-15 ENCOUNTER — Encounter: Payer: Self-pay | Admitting: Family Medicine

## 2021-12-16 ENCOUNTER — Encounter: Payer: Self-pay | Admitting: Obstetrics and Gynecology

## 2021-12-16 DIAGNOSIS — O365990 Maternal care for other known or suspected poor fetal growth, unspecified trimester, not applicable or unspecified: Secondary | ICD-10-CM | POA: Insufficient documentation

## 2021-12-17 ENCOUNTER — Other Ambulatory Visit: Payer: Self-pay | Admitting: *Deleted

## 2021-12-17 DIAGNOSIS — O365921 Maternal care for other known or suspected poor fetal growth, second trimester, fetus 1: Secondary | ICD-10-CM

## 2021-12-17 DIAGNOSIS — Z3689 Encounter for other specified antenatal screening: Secondary | ICD-10-CM

## 2021-12-18 ENCOUNTER — Encounter: Payer: Self-pay | Admitting: Advanced Practice Midwife

## 2021-12-24 ENCOUNTER — Telehealth: Payer: Self-pay

## 2021-12-24 NOTE — Telephone Encounter (Signed)
Called pt to inform her of Gender results. Left message for pt to call the office back.  ?Angel [name] l Sharice Harriss, CMA  ?

## 2021-12-28 ENCOUNTER — Encounter: Payer: Self-pay | Admitting: *Deleted

## 2021-12-28 ENCOUNTER — Ambulatory Visit: Payer: Managed Care, Other (non HMO) | Attending: Obstetrics and Gynecology

## 2021-12-28 ENCOUNTER — Other Ambulatory Visit: Payer: Self-pay | Admitting: Obstetrics and Gynecology

## 2021-12-28 ENCOUNTER — Ambulatory Visit: Payer: Managed Care, Other (non HMO) | Admitting: *Deleted

## 2021-12-28 VITALS — BP 108/58 | HR 95

## 2021-12-28 DIAGNOSIS — Z3689 Encounter for other specified antenatal screening: Secondary | ICD-10-CM

## 2021-12-28 DIAGNOSIS — O365920 Maternal care for other known or suspected poor fetal growth, second trimester, not applicable or unspecified: Secondary | ICD-10-CM | POA: Diagnosis present

## 2021-12-28 DIAGNOSIS — O365921 Maternal care for other known or suspected poor fetal growth, second trimester, fetus 1: Secondary | ICD-10-CM | POA: Insufficient documentation

## 2021-12-28 DIAGNOSIS — Z3A26 26 weeks gestation of pregnancy: Secondary | ICD-10-CM

## 2021-12-30 ENCOUNTER — Encounter: Payer: Self-pay | Admitting: Advanced Practice Midwife

## 2022-01-04 ENCOUNTER — Ambulatory Visit: Payer: Managed Care, Other (non HMO) | Admitting: *Deleted

## 2022-01-04 ENCOUNTER — Ambulatory Visit: Payer: Managed Care, Other (non HMO) | Attending: Obstetrics and Gynecology

## 2022-01-04 VITALS — BP 108/66 | HR 102

## 2022-01-04 DIAGNOSIS — O365920 Maternal care for other known or suspected poor fetal growth, second trimester, not applicable or unspecified: Secondary | ICD-10-CM | POA: Diagnosis present

## 2022-01-04 DIAGNOSIS — Z3689 Encounter for other specified antenatal screening: Secondary | ICD-10-CM | POA: Insufficient documentation

## 2022-01-04 DIAGNOSIS — Z3400 Encounter for supervision of normal first pregnancy, unspecified trimester: Secondary | ICD-10-CM

## 2022-01-04 DIAGNOSIS — Z3A27 27 weeks gestation of pregnancy: Secondary | ICD-10-CM

## 2022-01-04 DIAGNOSIS — O365921 Maternal care for other known or suspected poor fetal growth, second trimester, fetus 1: Secondary | ICD-10-CM | POA: Insufficient documentation

## 2022-01-07 ENCOUNTER — Other Ambulatory Visit: Payer: Self-pay | Admitting: *Deleted

## 2022-01-07 ENCOUNTER — Encounter: Payer: 59 | Admitting: Obstetrics & Gynecology

## 2022-01-07 DIAGNOSIS — O365920 Maternal care for other known or suspected poor fetal growth, second trimester, not applicable or unspecified: Secondary | ICD-10-CM

## 2022-01-07 NOTE — Progress Notes (Unsigned)
U

## 2022-01-08 ENCOUNTER — Encounter: Payer: Self-pay | Admitting: Advanced Practice Midwife

## 2022-01-08 ENCOUNTER — Ambulatory Visit (INDEPENDENT_AMBULATORY_CARE_PROVIDER_SITE_OTHER): Payer: Managed Care, Other (non HMO) | Admitting: Advanced Practice Midwife

## 2022-01-08 VITALS — BP 100/61 | HR 100 | Wt 107.0 lb

## 2022-01-08 DIAGNOSIS — Z3400 Encounter for supervision of normal first pregnancy, unspecified trimester: Secondary | ICD-10-CM

## 2022-01-08 DIAGNOSIS — Z3A28 28 weeks gestation of pregnancy: Secondary | ICD-10-CM

## 2022-01-08 DIAGNOSIS — O365990 Maternal care for other known or suspected poor fetal growth, unspecified trimester, not applicable or unspecified: Secondary | ICD-10-CM

## 2022-01-08 NOTE — Progress Notes (Signed)
? ?PRENATAL VISIT NOTE ? ?Subjective:  ?Angel [name] is a 31 y.o. G1P0 at [redacted]w[redacted]d being seen today for ongoing prenatal care.  She is currently monitored for the following issues for this high-risk pregnancy and has Preventative health care; Supervision of normal first pregnancy, antepartum; and Fetal growth restriction antepartum on their problem list. ? ?Patient reports  back pain, "dehydration", some reflux after eating large amount, concern over fetal growth restriction. .  Contractions: Not present. Vag. Bleeding: None.  Movement: Present. Denies leaking of fluid.  ? ?The following portions of the patient's history were reviewed and updated as appropriate: allergies, current medications, past family history, past medical history, past social history, past surgical history and problem list.  ? ?Objective:  ? ?Vitals:  ? 01/08/22 0906  ?BP: 100/61  ?Pulse: 100  ?Weight: 107 lb (48.5 kg)  ? ? ?Fetal Status: Fetal Heart Rate (bpm): 140   Movement: Present    ? ?General:  Alert, oriented and cooperative. Patient is in no acute distress.  ?Skin: Skin is warm and dry. No rash noted.   ?Cardiovascular: Normal heart rate noted  ?Respiratory: Normal respiratory effort, no problems with respiration noted  ?Abdomen: Soft, gravid, appropriate for gestational age.  Pain/Pressure: Present     ?Pelvic: Cervical exam deferred        ?Extremities: Normal range of motion.  Edema: None  ?Mental Status: Normal mood and affect. Normal behavior. Normal judgment and thought content.  ? ?Assessment and Plan:  ?Pregnancy: G1P0 at [redacted]w[redacted]d ?1. [redacted] weeks gestation of pregnancy ?    Glucola today ?- RPR ?- HIV antibody (with reflex) ?- CBC ?- Glucose Tolerance, 2 Hours w/1 Hour ? ?2. Supervision of normal first pregnancy, antepartum ? ?- RPR ?- HIV antibody (with reflex) ?- CBC ?- Glucose Tolerance, 2 Hours w/1 Hour ? ?3. Pregnancy affected by fetal growth restriction ?    Followed closely by MFM     ?    2%ile growth with normal dopplers ?     Discussed MFM will make recommendations as time goes on ?    Discussed ways to increase caloric intake ? ?Preterm labor symptoms and general obstetric precautions including but not limited to vaginal bleeding, contractions, leaking of fluid and fetal movement were reviewed in detail with the patient. ?Please refer to After Visit Summary for other counseling recommendations.  ? ?Return in about 2 weeks (around 01/22/2022) for [employer]. ? ?Future Appointments  ?Date Time Provider [place]  ?01/10/2022  9:30 AM [facility] NURSE [facility]  ?01/10/2022  9:45 AM [facility] US7 [facility]  ?01/10/2022 10:45 AM [facility] NST [facility] [place]  ?01/18/2022  3:00 PM [facility] NURSE [facility] [place]  ?01/18/2022  3:15 PM [facility] NST [facility] [place]  ?01/18/2022  3:45 PM [facility] US6 [facility]US [place]  ?01/22/2022  9:35 AM Jimmye Norman Wilkie Aye, CNM [facility] None  ?01/25/2022  2:30 PM [facility] NURSE [facility]  ?01/25/2022  2:45 PM [facility] US6 [facility]  ?02/01/2022  2:30 PM [facility] NURSE [facility]  ?02/01/2022  2:45 PM [facility] US4 [facility]  ?02/05/2022  8:55 AM Seabron Spates, CNM [facility] None  ?02/19/2022  8:55 AM Gavin Pound, CNM [facility] None  ?03/05/2022  8:55 AM Seabron Spates, CNM [facility] None  ?03/12/2022  8:55 AM Seabron Spates, CNM [facility] None  ?03/18/2022  8:55 AM Radene Gunning, MD [facility] None  ?03/26/2022  8:55 AM Seabron Spates, CNM [facility] None  ?04/02/2022  8:55 AM Seabron Spates, CNM [facility]  None  ? ? ?Hansel Feinstein, CNM ?

## 2022-01-09 LAB — CBC
Hematocrit: 35.3 % (ref 34.0–46.6)
Hemoglobin: 11.9 g/dL (ref 11.1–15.9)
MCH: 31 pg (ref 26.6–33.0)
MCHC: 33.7 g/dL (ref 31.5–35.7)
MCV: 92 fL (ref 79–97)
Platelets: 216 10*3/uL (ref 150–450)
RBC: 3.84 x10E6/uL (ref 3.77–5.28)
RDW: 11.4 % — ABNORMAL LOW (ref 11.7–15.4)
WBC: 7.2 10*3/uL (ref 3.4–10.8)

## 2022-01-09 LAB — GLUCOSE TOLERANCE, 2 HOURS W/ 1HR
Glucose, 1 hour: 83 mg/dL (ref 70–179)
Glucose, 2 hour: 41 mg/dL — ABNORMAL LOW (ref 70–152)
Glucose, Fasting: 70 mg/dL (ref 70–91)

## 2022-01-09 LAB — HIV ANTIBODY (ROUTINE TESTING W REFLEX): HIV Screen 4th Generation wRfx: NONREACTIVE

## 2022-01-09 LAB — RPR: RPR Ser Ql: NONREACTIVE

## 2022-01-10 ENCOUNTER — Ambulatory Visit (HOSPITAL_BASED_OUTPATIENT_CLINIC_OR_DEPARTMENT_OTHER): Payer: Managed Care, Other (non HMO) | Admitting: *Deleted

## 2022-01-10 ENCOUNTER — Ambulatory Visit: Payer: Managed Care, Other (non HMO) | Attending: Obstetrics

## 2022-01-10 ENCOUNTER — Encounter: Payer: Self-pay | Admitting: *Deleted

## 2022-01-10 ENCOUNTER — Ambulatory Visit: Payer: Managed Care, Other (non HMO) | Admitting: *Deleted

## 2022-01-10 VITALS — BP 112/60 | HR 90

## 2022-01-10 DIAGNOSIS — Z3A28 28 weeks gestation of pregnancy: Secondary | ICD-10-CM

## 2022-01-10 DIAGNOSIS — O280 Abnormal hematological finding on antenatal screening of mother: Secondary | ICD-10-CM | POA: Insufficient documentation

## 2022-01-10 DIAGNOSIS — O365930 Maternal care for other known or suspected poor fetal growth, third trimester, not applicable or unspecified: Secondary | ICD-10-CM

## 2022-01-10 DIAGNOSIS — O365920 Maternal care for other known or suspected poor fetal growth, second trimester, not applicable or unspecified: Secondary | ICD-10-CM

## 2022-01-10 DIAGNOSIS — O365921 Maternal care for other known or suspected poor fetal growth, second trimester, fetus 1: Secondary | ICD-10-CM | POA: Insufficient documentation

## 2022-01-10 NOTE — Procedures (Signed)
Angel [name] ?05-Feb-1991 ?[redacted]w[redacted]d ? ?Fetus A Non-Stress Test Interpretation for 01/10/22 ? ?Indication: IUGR ? ?Fetal Heart Rate A ?Mode: External ?Baseline Rate (A): 145 bpm ?Variability: Moderate ?Accelerations: 15 x 15 ?Decelerations: None ?Multiple birth?: No ? ?Uterine Activity ?Mode: Toco ?Contraction Frequency (min): UI ?Resting Tone Palpated: Relaxed ? ?Interpretation (Fetal Testing) ?Nonstress Test Interpretation: Reactive ?Overall Impression: Reassuring for gestational age ?Comments: tracing reviewed by Dr. Parke Poisson ? ? ?

## 2022-01-18 ENCOUNTER — Ambulatory Visit (HOSPITAL_BASED_OUTPATIENT_CLINIC_OR_DEPARTMENT_OTHER): Payer: Managed Care, Other (non HMO) | Admitting: *Deleted

## 2022-01-18 ENCOUNTER — Ambulatory Visit: Payer: Managed Care, Other (non HMO) | Admitting: *Deleted

## 2022-01-18 ENCOUNTER — Ambulatory Visit: Payer: Managed Care, Other (non HMO) | Attending: Obstetrics

## 2022-01-18 VITALS — BP 103/61 | HR 88

## 2022-01-18 DIAGNOSIS — O365930 Maternal care for other known or suspected poor fetal growth, third trimester, not applicable or unspecified: Secondary | ICD-10-CM

## 2022-01-18 DIAGNOSIS — Z3A29 29 weeks gestation of pregnancy: Secondary | ICD-10-CM

## 2022-01-18 DIAGNOSIS — Z3400 Encounter for supervision of normal first pregnancy, unspecified trimester: Secondary | ICD-10-CM

## 2022-01-18 DIAGNOSIS — O365920 Maternal care for other known or suspected poor fetal growth, second trimester, not applicable or unspecified: Secondary | ICD-10-CM

## 2022-01-18 NOTE — Procedures (Signed)
Ellen Henri ?1990-12-31 ?[redacted]w[redacted]d ? ?Fetus A Non-Stress Test Interpretation for 01/18/22 ? ?Indication: IUGR ? ?Fetal Heart Rate A ?Mode: External ?Baseline Rate (A): 140 bpm ?Accelerations: 15 x 15 ?Decelerations: None ?Multiple birth?: No ? ?Uterine Activity ?Mode: Palpation, Toco ?Contraction Frequency (min): none ?Resting Tone Palpated: Relaxed ? ?Interpretation (Fetal Testing) ?Nonstress Test Interpretation: Reactive ?Overall Impression: Reassuring for gestational age ?Comments: Dr. Donalee Citrin reviewed tracing ? ? ?

## 2022-01-22 ENCOUNTER — Ambulatory Visit (INDEPENDENT_AMBULATORY_CARE_PROVIDER_SITE_OTHER): Payer: Managed Care, Other (non HMO) | Admitting: Advanced Practice Midwife

## 2022-01-22 VITALS — BP 105/69 | HR 112 | Wt 109.0 lb

## 2022-01-22 DIAGNOSIS — O365990 Maternal care for other known or suspected poor fetal growth, unspecified trimester, not applicable or unspecified: Secondary | ICD-10-CM

## 2022-01-22 DIAGNOSIS — Z3A30 30 weeks gestation of pregnancy: Secondary | ICD-10-CM

## 2022-01-22 NOTE — Progress Notes (Signed)
? ?  PRENATAL VISIT NOTE ? ?Subjective:  ?Angel [name] is a 31 y.o. G1P0 at [redacted]w[redacted]d being seen today for ongoing prenatal care.  She is currently monitored for the following issues for this high-risk pregnancy and has Preventative health care; Supervision of normal first pregnancy, antepartum; and Fetal growth restriction antepartum on their problem list. ? ?Patient reports no complaints.  Contractions: Irritability. Vag. Bleeding: None.  Movement: Present. Denies leaking of fluid.  ? ?The following portions of the patient's history were reviewed and updated as appropriate: allergies, current medications, past family history, past medical history, past social history, past surgical history and problem list.  ? ?Objective:  ? ?Vitals:  ? 01/22/22 0938  ?Weight: 109 lb (49.4 kg)  ? ? ?Fetal Status:     Movement: Present    ? ?General:  Alert, oriented and cooperative. Patient is in no acute distress.  ?Skin: Skin is warm and dry. No rash noted.   ?Cardiovascular: Normal heart rate noted  ?Respiratory: Normal respiratory effort, no problems with respiration noted  ?Abdomen: Soft, gravid, appropriate for gestational age.  Pain/Pressure: Present     ?Pelvic: Cervical exam deferred        ?Extremities: Normal range of motion.  Edema: None  ?Mental Status: Normal mood and affect. Normal behavior. Normal judgment and thought content.  ? ?Assessment and Plan:  ?Pregnancy: G1P0 at [redacted]w[redacted]d ?1. [redacted] weeks gestation of pregnancy ?     States baby moves frequently ?     Questions answered re: weight gain (14 lb total), exercise, fetal movement, breast growth, advancing fundal height.  ? ?2. Pregnancy affected by fetal growth restriction ?     1.6%ile on 01/04/22, normal dopplers this week, AFI 17%ile ?     Followup scheduled by MFM ? ?Preterm labor symptoms and general obstetric precautions including but not limited to vaginal bleeding, contractions, leaking of fluid and fetal movement were reviewed in detail with the patient. ?Please  refer to After Visit Summary for other counseling recommendations.  ? ?No follow-ups on file. ? ?Future Appointments  ?Date Time Provider [place]  ?01/25/2022  2:30 PM [facility] NURSE [facility]  ?01/25/2022  2:45 PM [facility] US6 [facility]  ?02/01/2022  2:30 PM [facility] NURSE [facility]  ?02/01/2022  2:45 PM [facility] US4 [facility]  ?02/05/2022  8:55 AM Seabron Spates, CNM [facility] None  ?02/19/2022  8:55 AM Gavin Pound, CNM [facility] None  ?03/05/2022  8:55 AM Seabron Spates, CNM [facility] None  ?03/12/2022  8:55 AM Seabron Spates, CNM [facility] None  ?03/18/2022  8:55 AM Radene Gunning, MD [facility] None  ?03/26/2022  8:55 AM Seabron Spates, CNM [facility] None  ?04/02/2022  8:55 AM Seabron Spates, CNM [facility] None  ? ? ?Hansel Feinstein, CNM ?

## 2022-01-25 ENCOUNTER — Ambulatory Visit: Payer: Managed Care, Other (non HMO) | Admitting: *Deleted

## 2022-01-25 ENCOUNTER — Ambulatory Visit: Payer: Managed Care, Other (non HMO) | Attending: Obstetrics

## 2022-01-25 ENCOUNTER — Encounter: Payer: Self-pay | Admitting: *Deleted

## 2022-01-25 VITALS — BP 105/61 | HR 97

## 2022-01-25 DIAGNOSIS — O365920 Maternal care for other known or suspected poor fetal growth, second trimester, not applicable or unspecified: Secondary | ICD-10-CM

## 2022-01-25 DIAGNOSIS — Z3400 Encounter for supervision of normal first pregnancy, unspecified trimester: Secondary | ICD-10-CM | POA: Insufficient documentation

## 2022-01-25 DIAGNOSIS — O365930 Maternal care for other known or suspected poor fetal growth, third trimester, not applicable or unspecified: Secondary | ICD-10-CM

## 2022-01-25 DIAGNOSIS — Z3A30 30 weeks gestation of pregnancy: Secondary | ICD-10-CM | POA: Diagnosis not present

## 2022-01-25 NOTE — Procedures (Signed)
[name] ?[birth year] ?[redacted]w[redacted]d ? ?Fetus A Non-Stress Test Interpretation for 01/25/22 ? ?Indication: IUGR ? ?Fetal Heart Rate A ?Mode: External ?Baseline Rate (A): 140 bpm ?Variability: Moderate ?Accelerations: 15 x 15 ?Decelerations: None ?Multiple birth?: No ? ?Uterine Activity ?Mode: Palpation, Toco ?Contraction Frequency (min): ui ?Resting Tone Palpated: Relaxed ? ?Interpretation (Fetal Testing) ?Nonstress Test Interpretation: Reactive ?Overall Impression: Reassuring for gestational age ?Comments: Dr. Parke Poisson reviewed tracing ? ? ?

## 2022-01-28 ENCOUNTER — Other Ambulatory Visit: Payer: Self-pay | Admitting: *Deleted

## 2022-01-28 DIAGNOSIS — O365930 Maternal care for other known or suspected poor fetal growth, third trimester, not applicable or unspecified: Secondary | ICD-10-CM

## 2022-02-01 ENCOUNTER — Ambulatory Visit: Payer: Managed Care, Other (non HMO) | Admitting: *Deleted

## 2022-02-01 ENCOUNTER — Ambulatory Visit: Payer: Managed Care, Other (non HMO) | Attending: Obstetrics

## 2022-02-01 ENCOUNTER — Ambulatory Visit (HOSPITAL_BASED_OUTPATIENT_CLINIC_OR_DEPARTMENT_OTHER): Payer: Managed Care, Other (non HMO) | Admitting: *Deleted

## 2022-02-01 VITALS — BP 105/63 | HR 115

## 2022-02-01 DIAGNOSIS — O365920 Maternal care for other known or suspected poor fetal growth, second trimester, not applicable or unspecified: Secondary | ICD-10-CM

## 2022-02-01 DIAGNOSIS — Z3A31 31 weeks gestation of pregnancy: Secondary | ICD-10-CM | POA: Diagnosis not present

## 2022-02-01 DIAGNOSIS — O365930 Maternal care for other known or suspected poor fetal growth, third trimester, not applicable or unspecified: Secondary | ICD-10-CM | POA: Insufficient documentation

## 2022-02-01 DIAGNOSIS — Z3400 Encounter for supervision of normal first pregnancy, unspecified trimester: Secondary | ICD-10-CM

## 2022-02-01 DIAGNOSIS — O365990 Maternal care for other known or suspected poor fetal growth, unspecified trimester, not applicable or unspecified: Secondary | ICD-10-CM | POA: Diagnosis not present

## 2022-02-01 NOTE — Procedures (Signed)
Angel [name] 19-Apr-1991 [redacted]w[redacted]d  Fetus A Non-Stress Test Interpretation for 02/01/22  Indication:  FGR  Fetal Heart Rate A Mode: External Baseline Rate (A): 140 bpm Variability: Moderate Accelerations: 15 x 15 Decelerations: None Multiple birth?: No  Uterine Activity Mode: Palpation, Toco Contraction Frequency (min): none Resting Tone Palpated: Relaxed  Interpretation (Fetal Testing) Nonstress Test Interpretation: Reactive Overall Impression: Reassuring for gestational age Comments: Dr. Grace Bushy reviewed tracing

## 2022-02-04 ENCOUNTER — Other Ambulatory Visit: Payer: Self-pay | Admitting: Obstetrics

## 2022-02-04 DIAGNOSIS — O365920 Maternal care for other known or suspected poor fetal growth, second trimester, not applicable or unspecified: Secondary | ICD-10-CM

## 2022-02-05 ENCOUNTER — Ambulatory Visit (INDEPENDENT_AMBULATORY_CARE_PROVIDER_SITE_OTHER): Payer: 59 | Admitting: Advanced Practice Midwife

## 2022-02-05 VITALS — BP 99/54 | HR 96 | Wt 112.0 lb

## 2022-02-05 DIAGNOSIS — O365990 Maternal care for other known or suspected poor fetal growth, unspecified trimester, not applicable or unspecified: Secondary | ICD-10-CM

## 2022-02-05 DIAGNOSIS — Z3A32 32 weeks gestation of pregnancy: Secondary | ICD-10-CM

## 2022-02-05 NOTE — Progress Notes (Signed)
   PRENATAL VISIT NOTE  Subjective:  Angel [name] is a 31 y.o. G1P0 at [redacted]w[redacted]d being seen today for ongoing prenatal care.  She is currently monitored for the following issues for this high-risk pregnancy and has Preventative health care; Supervision of normal first pregnancy, antepartum; and Fetal growth restriction antepartum on their problem list.  Patient reports no complaints.   .  .   . Denies leaking of fluid.   The following portions of the patient's history were reviewed and updated as appropriate: allergies, current medications, past family history, past medical history, past social history, past surgical history and problem list.   Objective:  There were no vitals filed for this visit.  Fetal Status:           General:  Alert, oriented and cooperative. Patient is in no acute distress.  Skin: Skin is warm and dry. No rash noted.   Cardiovascular: Normal heart rate noted  Respiratory: Normal respiratory effort, no problems with respiration noted  Abdomen: Soft, gravid, appropriate for gestational age.        Pelvic: Cervical exam deferred        Extremities: Normal range of motion.     Mental Status: Normal mood and affect. Normal behavior. Normal judgment and thought content.   Assessment and Plan:  Pregnancy: G1P0 at [redacted]w[redacted]d 1. Pregnancy affected by fetal growth restriction     <1%ile     Good dopplers      Followed closely by MFM      Recommended decrease exposure to second hand smoke  2. 32 week prematurity      Plan delivery at 37 weeks  Preterm labor symptoms and general obstetric precautions including but not limited to vaginal bleeding, contractions, leaking of fluid and fetal movement were reviewed in detail with the patient. Please refer to After Visit Summary for other counseling recommendations.     Future Appointments  Date Time Provider [place]  02/05/2022  8:55 AM Seabron Spates, CNM [facility] None  02/08/2022  3:30 PM [facility] NURSE [facility]  [facility]  02/08/2022  3:45 PM [facility] US6 [facility]  02/15/2022  2:30 PM [facility] NURSE [facility]  02/15/2022  2:45 PM [facility] US6 [facility]  02/15/2022  3:15 PM [facility] NST [facility]  02/19/2022  8:55 AM Gavin Pound, CNM [facility] None  02/22/2022  2:15 PM [facility] NURSE [facility]  02/22/2022  2:30 PM [facility] US3 [facility]  02/22/2022  3:15 PM [facility] NST [facility]  03/05/2022  8:55 AM Seabron Spates, CNM [facility] None  03/12/2022  8:55 AM Seabron Spates, CNM [facility] None  03/18/2022  8:55 AM Radene Gunning, MD [facility] None  03/26/2022  8:55 AM Seabron Spates, CNM [facility] None  04/02/2022  8:55 AM Seabron Spates, CNM [facility] None    Hansel Feinstein, CNM

## 2022-02-08 ENCOUNTER — Encounter: Payer: Self-pay | Admitting: *Deleted

## 2022-02-08 ENCOUNTER — Ambulatory Visit: Payer: Managed Care, Other (non HMO) | Attending: Obstetrics

## 2022-02-08 ENCOUNTER — Ambulatory Visit: Payer: Managed Care, Other (non HMO) | Admitting: *Deleted

## 2022-02-08 VITALS — BP 98/54 | HR 86

## 2022-02-08 DIAGNOSIS — Z3A32 32 weeks gestation of pregnancy: Secondary | ICD-10-CM | POA: Insufficient documentation

## 2022-02-08 DIAGNOSIS — Z3400 Encounter for supervision of normal first pregnancy, unspecified trimester: Secondary | ICD-10-CM | POA: Insufficient documentation

## 2022-02-08 DIAGNOSIS — O365931 Maternal care for other known or suspected poor fetal growth, third trimester, fetus 1: Secondary | ICD-10-CM | POA: Insufficient documentation

## 2022-02-08 DIAGNOSIS — O365930 Maternal care for other known or suspected poor fetal growth, third trimester, not applicable or unspecified: Secondary | ICD-10-CM | POA: Diagnosis not present

## 2022-02-08 NOTE — Progress Notes (Signed)
[id number]  

## 2022-02-08 NOTE — Procedures (Signed)
Sicily Zaragoza May 09, 1991 [redacted]w[redacted]d  Fetus A Non-Stress Test Interpretation for 02/08/22  Indication: IUGR  Fetal Heart Rate A Mode: External Baseline Rate (A): 140 bpm Variability: Moderate Accelerations: 15 x 15 Decelerations: None Multiple birth?: No  Uterine Activity Mode: Toco Contraction Frequency (min): none  Interpretation (Fetal Testing) Nonstress Test Interpretation: Reactive Overall Impression: Reassuring for gestational age Comments: tracing reviewed by Dr. Parke Poisson

## 2022-02-15 ENCOUNTER — Ambulatory Visit: Payer: Managed Care, Other (non HMO) | Attending: Obstetrics

## 2022-02-15 ENCOUNTER — Ambulatory Visit: Payer: Managed Care, Other (non HMO) | Admitting: *Deleted

## 2022-02-15 ENCOUNTER — Ambulatory Visit (HOSPITAL_BASED_OUTPATIENT_CLINIC_OR_DEPARTMENT_OTHER): Payer: Managed Care, Other (non HMO) | Admitting: *Deleted

## 2022-02-15 VITALS — BP 111/65 | HR 94

## 2022-02-15 DIAGNOSIS — O365990 Maternal care for other known or suspected poor fetal growth, unspecified trimester, not applicable or unspecified: Secondary | ICD-10-CM

## 2022-02-15 DIAGNOSIS — Z3A33 33 weeks gestation of pregnancy: Secondary | ICD-10-CM | POA: Diagnosis not present

## 2022-02-15 DIAGNOSIS — O365930 Maternal care for other known or suspected poor fetal growth, third trimester, not applicable or unspecified: Secondary | ICD-10-CM | POA: Insufficient documentation

## 2022-02-15 DIAGNOSIS — Z3400 Encounter for supervision of normal first pregnancy, unspecified trimester: Secondary | ICD-10-CM

## 2022-02-15 NOTE — Procedures (Signed)
Angel [name] 03/30/1991 [redacted]w[redacted]d  Fetus A Non-Stress Test Interpretation for 02/15/22  Indication:  FGR  Fetal Heart Rate A Mode: External Baseline Rate (A): 140 bpm Variability: Moderate Accelerations: 15 x 15 Decelerations: None Multiple birth?: No  Uterine Activity Mode: Palpation, Toco Contraction Frequency (min): 1 uc with ui Contraction Duration (sec): 60 Contraction Quality: Mild Resting Tone Palpated: Relaxed Resting Time: Adequate  Interpretation (Fetal Testing) Nonstress Test Interpretation: Reactive Overall Impression: Reassuring for gestational age Comments: Dr. Gertie Exon reviewed tracing

## 2022-02-18 ENCOUNTER — Other Ambulatory Visit: Payer: Self-pay | Admitting: *Deleted

## 2022-02-18 DIAGNOSIS — O365990 Maternal care for other known or suspected poor fetal growth, unspecified trimester, not applicable or unspecified: Secondary | ICD-10-CM

## 2022-02-19 ENCOUNTER — Ambulatory Visit (INDEPENDENT_AMBULATORY_CARE_PROVIDER_SITE_OTHER): Payer: Managed Care, Other (non HMO)

## 2022-02-19 VITALS — BP 93/50 | HR 87 | Wt 115.0 lb

## 2022-02-19 DIAGNOSIS — Z3A34 34 weeks gestation of pregnancy: Secondary | ICD-10-CM

## 2022-02-19 DIAGNOSIS — Z3400 Encounter for supervision of normal first pregnancy, unspecified trimester: Secondary | ICD-10-CM

## 2022-02-19 DIAGNOSIS — O365990 Maternal care for other known or suspected poor fetal growth, unspecified trimester, not applicable or unspecified: Secondary | ICD-10-CM

## 2022-02-19 NOTE — Progress Notes (Signed)
LOW-RISK PREGNANCY OFFICE VISIT  Patient name: Angel [name] MRN [id number]  Date of birth: 22-Sep-1990 Chief Complaint:   No chief complaint on file.  Subjective:   Angel [name] is a 31 y.o. G1P0 female at [redacted]w[redacted]d with an Estimated Date of Delivery: 04/02/22 being seen today for ongoing management of a low-risk pregnancy aeb has Preventative health care; Supervision of normal first pregnancy, antepartum; and Fetal growth restriction antepartum on their problem list.  Patient presents today,alone, with  c/o bilateral leg pain .  She reports this not a new pain and it used to occur with menstruation.  She states it would resolve with pressure, but not now.  Patient endorses fetal movement. Patient denies abdominal cramping or contractions.  Patient denies vaginal concerns including abnormal discharge, leaking of fluid, and bleeding. She does report some "very very small amount" of blood on Friday. She reports it was self-limiting and had no other incidents.  No issues with urination, constipation, or diarrhea. Patient questions health of infant.   Contractions: Not present. Vag. Bleeding: Scant.  Movement: Present.  Reviewed past medical,surgical, social, obstetrical and family history as well as problem list, medications and allergies.  Objective   Vitals:   02/19/22 0923  BP: (!) 93/50  Pulse: 87  Weight: 115 lb (52.2 kg)  Body mass index is 21.73 kg/m.  Total Weight Gain:25 lb (11.3 kg)         Physical Examination:   General appearance: Well appearing, and in no distress  Mental status: Alert, oriented to person, place, and time  Skin: Warm & dry  Cardiovascular: Normal heart rate noted  Respiratory: Normal respiratory effort, no distress  Abdomen: Soft, gravid, nontender  Pelvic: Cervical exam deferred           Extremities: Edema: None  Fetal Status: Fetal Heart Rate (bpm): 144  Movement: Present   No results found for this or any previous visit (from the past 24 hour(s)).   Assessment & Plan:  Low-risk pregnancy of a 31 y.o., G1P0 at [redacted]w[redacted]d with an Estimated Date of Delivery: 04/02/22   1. Supervision of normal first pregnancy, antepartum -Anticipatory guidance for upcoming appts. -Patient to schedule next appt in 2 weeks for an in-person visit.   2. [redacted] weeks gestation of pregnancy -Doing well overall.  -Reassured that bilateral intermittent leg pain may be due to weight gain since pregnancy. -Encouraged to monitor and rest when possible.  Instructed to report worsening of symptoms. -Reassured that self-limiting vaginal bleeding is notable and noted today, but usually requires no further intervention. Encouraged to monitor.   3. Pregnancy affected by fetal growth restriction -Extensive discussion regarding US findings c/w FGR -Informed of MFM recommendation for delivery at 37 weeks. -Discussed indications and rationale for delivery at this time. -Further informed that earlier delivery may be recommended by future developments as deemed relevant by MFM. -Patient appropriately tearful and concerned regarding health of fetus.  -Reassured that 37 weeks is considered full term, but concern is for fetal growth. -Cautioned that NICU intervention may necessary.  Further cautioned that if fetal intolerance to labor identified surgical intervention may be necessary. -Reviewed induction process and patient requests delivery around July 1st or later.  Reiterated fetal status and importance of starting induction process at earliest possible interval to avoid negative outcomes. -Patient verbalizes understanding.   -Encouraged to discuss concerns and address any other questions that may arise with MFM.     Meds: No orders of the defined types were placed in  this encounter.  Labs/procedures today:  Lab Orders  No laboratory test(s) ordered today     Reviewed: Preterm labor symptoms and general obstetric precautions including but not limited to vaginal bleeding,  contractions, leaking of fluid and fetal movement were reviewed in detail with the patient.  All questions were answered.  Follow-up: No follow-ups on file.  No orders of the defined types were placed in this encounter.  Cherre Robins MSN, CNM 02/19/2022

## 2022-02-22 ENCOUNTER — Ambulatory Visit: Payer: Managed Care, Other (non HMO) | Admitting: *Deleted

## 2022-02-22 ENCOUNTER — Ambulatory Visit: Payer: Managed Care, Other (non HMO) | Attending: Obstetrics and Gynecology

## 2022-02-22 ENCOUNTER — Encounter (HOSPITAL_COMMUNITY): Payer: Self-pay

## 2022-02-22 ENCOUNTER — Telehealth (HOSPITAL_COMMUNITY): Payer: Self-pay | Admitting: *Deleted

## 2022-02-22 VITALS — BP 114/68 | HR 106

## 2022-02-22 DIAGNOSIS — Z3A34 34 weeks gestation of pregnancy: Secondary | ICD-10-CM | POA: Diagnosis not present

## 2022-02-22 DIAGNOSIS — O365990 Maternal care for other known or suspected poor fetal growth, unspecified trimester, not applicable or unspecified: Secondary | ICD-10-CM

## 2022-02-22 DIAGNOSIS — O365930 Maternal care for other known or suspected poor fetal growth, third trimester, not applicable or unspecified: Secondary | ICD-10-CM

## 2022-02-22 DIAGNOSIS — Z3400 Encounter for supervision of normal first pregnancy, unspecified trimester: Secondary | ICD-10-CM

## 2022-02-22 DIAGNOSIS — O365931 Maternal care for other known or suspected poor fetal growth, third trimester, fetus 1: Secondary | ICD-10-CM

## 2022-02-22 NOTE — Procedures (Signed)
Angel [name] [birth year] [redacted]w[redacted]d  Fetus A Non-Stress Test Interpretation for 02/22/22  Indication: IUGR  Fetal Heart Rate A Mode: External Baseline Rate (A): 135 bpm Variability: Moderate Accelerations: 15 x 15 Decelerations: None Multiple birth?: No  Uterine Activity Mode: Toco Contraction Frequency (min): none Contraction Quality: Mild Resting Tone Palpated: Relaxed  Interpretation (Fetal Testing) Nonstress Test Interpretation: Reactive Overall Impression: Reassuring for gestational age Comments: tracing reviewed by Dr. Judeth Cornfield

## 2022-02-22 NOTE — Telephone Encounter (Signed)
Preadmission screen  

## 2022-02-25 ENCOUNTER — Telehealth (HOSPITAL_COMMUNITY): Payer: Self-pay | Admitting: *Deleted

## 2022-02-25 ENCOUNTER — Encounter (HOSPITAL_COMMUNITY): Payer: Self-pay | Admitting: *Deleted

## 2022-02-25 ENCOUNTER — Ambulatory Visit (INDEPENDENT_AMBULATORY_CARE_PROVIDER_SITE_OTHER): Payer: Managed Care, Other (non HMO)

## 2022-02-25 DIAGNOSIS — Z23 Encounter for immunization: Secondary | ICD-10-CM | POA: Diagnosis not present

## 2022-02-25 NOTE — Telephone Encounter (Signed)
Preadmission screen  

## 2022-02-25 NOTE — Progress Notes (Signed)
Patient presents to receive TDAP vaccine. Patient decided that she wanted this and scheduled nurse visit before her next OB appointment. Patient is 33. 6 weeks today.

## 2022-02-28 ENCOUNTER — Other Ambulatory Visit: Payer: Self-pay | Admitting: *Deleted

## 2022-02-28 DIAGNOSIS — O365990 Maternal care for other known or suspected poor fetal growth, unspecified trimester, not applicable or unspecified: Secondary | ICD-10-CM

## 2022-03-01 ENCOUNTER — Ambulatory Visit: Payer: Managed Care, Other (non HMO) | Admitting: *Deleted

## 2022-03-01 ENCOUNTER — Ambulatory Visit: Payer: Managed Care, Other (non HMO) | Attending: Obstetrics and Gynecology

## 2022-03-01 VITALS — BP 112/56 | HR 91

## 2022-03-01 DIAGNOSIS — O365990 Maternal care for other known or suspected poor fetal growth, unspecified trimester, not applicable or unspecified: Secondary | ICD-10-CM | POA: Insufficient documentation

## 2022-03-01 DIAGNOSIS — O365930 Maternal care for other known or suspected poor fetal growth, third trimester, not applicable or unspecified: Secondary | ICD-10-CM

## 2022-03-01 DIAGNOSIS — Z3A35 35 weeks gestation of pregnancy: Secondary | ICD-10-CM | POA: Diagnosis not present

## 2022-03-01 NOTE — Procedures (Signed)
Angel [name] Aug 23, 1991 [redacted]w[redacted]d  Fetus A Non-Stress Test Interpretation for 03/01/22  Indication: IUGR and Unsatisfactory BPP  Fetal Heart Rate A Mode: External Baseline Rate (A): 135 bpm Variability: Moderate Accelerations: 15 x 15 Decelerations: None Multiple birth?: No  Uterine Activity Mode: Palpation, Toco Contraction Frequency (min): UI Contraction Quality: Mild Resting Tone Palpated: Relaxed Resting Time: Adequate  Interpretation (Fetal Testing) Nonstress Test Interpretation: Reactive Comments: Dr. Parke Poisson reviewed tracing. Audible irregular beats noted.

## 2022-03-04 ENCOUNTER — Other Ambulatory Visit: Payer: Self-pay | Admitting: Maternal & Fetal Medicine

## 2022-03-04 DIAGNOSIS — O365990 Maternal care for other known or suspected poor fetal growth, unspecified trimester, not applicable or unspecified: Secondary | ICD-10-CM

## 2022-03-05 ENCOUNTER — Other Ambulatory Visit (HOSPITAL_COMMUNITY)
Admission: RE | Admit: 2022-03-05 | Discharge: 2022-03-05 | Disposition: A | Discharge status: Home / Self Care | Payer: Managed Care, Other (non HMO) | Source: Ambulatory Visit | Attending: Advanced Practice Midwife | Admitting: Advanced Practice Midwife

## 2022-03-05 ENCOUNTER — Ambulatory Visit (INDEPENDENT_AMBULATORY_CARE_PROVIDER_SITE_OTHER): Payer: Managed Care, Other (non HMO) | Admitting: Advanced Practice Midwife

## 2022-03-05 VITALS — BP 106/87 | HR 90 | Wt 116.0 lb

## 2022-03-05 DIAGNOSIS — O0993 Supervision of high risk pregnancy, unspecified, third trimester: Secondary | ICD-10-CM | POA: Diagnosis present

## 2022-03-05 DIAGNOSIS — T7491XA Unspecified adult maltreatment, confirmed, initial encounter: Secondary | ICD-10-CM

## 2022-03-05 DIAGNOSIS — Z3A36 36 weeks gestation of pregnancy: Secondary | ICD-10-CM | POA: Insufficient documentation

## 2022-03-05 DIAGNOSIS — O365990 Maternal care for other known or suspected poor fetal growth, unspecified trimester, not applicable or unspecified: Secondary | ICD-10-CM
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
## 2022-03-06 LAB — GC/CHLAMYDIA PROBE AMP (~~LOC~~) NOT AT ARMC
Chlamydia: NEGATIVE
Comment: NEGATIVE
Comment: NORMAL
Neisseria Gonorrhea: NEGATIVE

## 2022-03-07 ENCOUNTER — Telehealth: Payer: Self-pay | Admitting: Clinical

## 2022-03-08 ENCOUNTER — Ambulatory Visit: Payer: Managed Care, Other (non HMO) | Admitting: *Deleted

## 2022-03-08 ENCOUNTER — Other Ambulatory Visit: Payer: Self-pay | Admitting: Maternal & Fetal Medicine

## 2022-03-08 ENCOUNTER — Encounter: Payer: Self-pay | Admitting: *Deleted

## 2022-03-08 ENCOUNTER — Ambulatory Visit: Payer: Managed Care, Other (non HMO) | Attending: Obstetrics and Gynecology

## 2022-03-08 VITALS — BP 103/66 | HR 84

## 2022-03-08 DIAGNOSIS — O365930 Maternal care for other known or suspected poor fetal growth, third trimester, not applicable or unspecified: Secondary | ICD-10-CM | POA: Insufficient documentation

## 2022-03-08 DIAGNOSIS — O365931 Maternal care for other known or suspected poor fetal growth, third trimester, fetus 1: Secondary | ICD-10-CM | POA: Insufficient documentation

## 2022-03-08 DIAGNOSIS — O365990 Maternal care for other known or suspected poor fetal growth, unspecified trimester, not applicable or unspecified: Secondary | ICD-10-CM | POA: Insufficient documentation

## 2022-03-08 DIAGNOSIS — Z3A36 36 weeks gestation of pregnancy: Secondary | ICD-10-CM

## 2022-03-09 LAB — CULTURE, BETA STREP (GROUP B ONLY): Strep Gp B Culture: NEGATIVE

## 2022-03-11 ENCOUNTER — Telehealth: Payer: Self-pay | Admitting: Clinical

## 2022-03-11 NOTE — Telephone Encounter (Signed)
Return call to pt; pt wants to know "how can I name the baby"; pt is informed that she will be provided with forms after the baby is born to name the baby; if she has any questions, she can ask at the hospital prior to completing forms. Pt appreciates this information, as she's starting to feel "some pains" now, and thinks she will be going to the hospital prior to scheduled appointment tomorrow.

## 2022-03-12 ENCOUNTER — Inpatient Hospital Stay (HOSPITAL_COMMUNITY): Payer: Managed Care, Other (non HMO) | Attending: Family Medicine

## 2022-03-12 ENCOUNTER — Inpatient Hospital Stay (HOSPITAL_COMMUNITY)
Admission: RE | Admit: 2022-03-12 | Discharge: 2022-03-16 | DRG: 787 | Disposition: A | Discharge status: Home / Self Care | Payer: Managed Care, Other (non HMO) | Attending: Obstetrics and Gynecology | Admitting: Obstetrics and Gynecology

## 2022-03-12 ENCOUNTER — Encounter: Payer: 59 | Admitting: Advanced Practice Midwife

## 2022-03-12 ENCOUNTER — Other Ambulatory Visit: Payer: Self-pay

## 2022-03-12 ENCOUNTER — Encounter (HOSPITAL_COMMUNITY): Payer: Self-pay | Admitting: Obstetrics and Gynecology

## 2022-03-12 ENCOUNTER — Inpatient Hospital Stay (HOSPITAL_COMMUNITY)
Admission: AD | Admit: 2022-03-12 | Payer: Managed Care, Other (non HMO) | Source: Home / Self Care | Admitting: Family Medicine

## 2022-03-12 ENCOUNTER — Inpatient Hospital Stay (HOSPITAL_COMMUNITY): Payer: Managed Care, Other (non HMO) | Admitting: Anesthesiology

## 2022-03-12 DIAGNOSIS — Z3A37 37 weeks gestation of pregnancy: Secondary | ICD-10-CM | POA: Diagnosis not present

## 2022-03-12 DIAGNOSIS — Z0000 Encounter for general adult medical examination without abnormal findings: Secondary | ICD-10-CM

## 2022-03-12 DIAGNOSIS — O9081 Anemia of the puerperium: Secondary | ICD-10-CM | POA: Diagnosis not present

## 2022-03-12 DIAGNOSIS — O365990 Maternal care for other known or suspected poor fetal growth, unspecified trimester, not applicable or unspecified: Secondary | ICD-10-CM | POA: Diagnosis present

## 2022-03-12 DIAGNOSIS — D62 Acute posthemorrhagic anemia: Secondary | ICD-10-CM | POA: Diagnosis not present

## 2022-03-12 DIAGNOSIS — Z3400 Encounter for supervision of normal first pregnancy, unspecified trimester: Secondary | ICD-10-CM

## 2022-03-12 DIAGNOSIS — T7491XA Unspecified adult maltreatment, confirmed, initial encounter: Secondary | ICD-10-CM | POA: Diagnosis present

## 2022-03-12 DIAGNOSIS — O0990 Supervision of high risk pregnancy, unspecified, unspecified trimester: Secondary | ICD-10-CM

## 2022-03-12 DIAGNOSIS — O365930 Maternal care for other known or suspected poor fetal growth, third trimester, not applicable or unspecified: Principal | ICD-10-CM | POA: Diagnosis present

## 2022-03-12 DIAGNOSIS — O9A32 Physical abuse complicating childbirth: Secondary | ICD-10-CM | POA: Diagnosis not present

## 2022-03-12 LAB — TYPE AND SCREEN
ABO/RH(D): B POS
Antibody Screen: NEGATIVE

## 2022-03-12 LAB — RPR: RPR Ser Ql: NONREACTIVE

## 2022-03-12 LAB — CBC
HCT: 37 % (ref 36.0–46.0)
Hemoglobin: 12.7 g/dL (ref 12.0–15.0)
MCH: 32.2 pg (ref 26.0–34.0)
MCHC: 34.3 g/dL (ref 30.0–36.0)
MCV: 93.7 fL (ref 80.0–100.0)
Platelets: 243 10*3/uL (ref 150–400)
RBC: 3.95 MIL/uL (ref 3.87–5.11)
RDW: 12.2 % (ref 11.5–15.5)
WBC: 9.9 10*3/uL (ref 4.0–10.5)
nRBC: 0 % (ref 0.0–0.2)

## 2022-03-12 MED ORDER — OXYTOCIN-SODIUM CHLORIDE 30-0.9 UT/500ML-% IV SOLN
1.0000 m[IU]/min | INTRAVENOUS | Status: DC
  Administered 2022-03-12: 1 m[IU]/min via INTRAVENOUS

## 2022-03-12 MED ORDER — FENTANYL-BUPIVACAINE-NACL 0.5-0.125-0.9 MG/250ML-% EP SOLN: 12.0000 mL/h | EPIDURAL | Status: DC | PRN

## 2022-03-12 MED ORDER — LACTATED RINGERS AMNIOINFUSION: INTRAVENOUS | Status: DC

## 2022-03-12 MED ORDER — EPHEDRINE 5 MG/ML INJ: 10.0000 mg | INTRAVENOUS | Status: DC | PRN

## 2022-03-12 MED ORDER — OXYTOCIN-SODIUM CHLORIDE 30-0.9 UT/500ML-% IV SOLN
1.0000 m[IU]/min | INTRAVENOUS | Status: DC
  Administered 2022-03-12 (×2): 2 m[IU]/min via INTRAVENOUS

## 2022-03-12 MED ORDER — TERBUTALINE SULFATE 1 MG/ML IJ SOLN
0.2500 mg | Freq: Once | INTRAMUSCULAR | Status: AC | PRN
Start: 2022-03-12 — End: 2022-03-12
  Administered 2022-03-12: 0.25 mg via SUBCUTANEOUS
  Filled 2022-03-12: qty 1

## 2022-03-12 MED ORDER — OXYTOCIN BOLUS FROM INFUSION: 333.0000 mL | Freq: Once | INTRAVENOUS | Status: DC

## 2022-03-12 MED ORDER — LACTATED RINGERS IV SOLN
500.0000 mL | INTRAVENOUS | Status: DC | PRN
  Administered 2022-03-13: 500 mL via INTRAVENOUS

## 2022-03-12 MED ORDER — FENTANYL-BUPIVACAINE-NACL 0.5-0.125-0.9 MG/250ML-% EP SOLN
12.0000 mL/h | EPIDURAL | Status: DC | PRN
  Administered 2022-03-12: 10 mL/h via EPIDURAL
  Filled 2022-03-12: qty 250

## 2022-03-12 MED ORDER — OXYCODONE-ACETAMINOPHEN 5-325 MG PO TABS: 2.0000 | ORAL_TABLET | ORAL | Status: DC | PRN

## 2022-03-12 MED ORDER — TERBUTALINE SULFATE 1 MG/ML IJ SOLN
0.2500 mg | Freq: Once | INTRAMUSCULAR | Status: AC | PRN
  Administered 2022-03-13: 0.25 mg via SUBCUTANEOUS

## 2022-03-12 MED ORDER — ACETAMINOPHEN 325 MG PO TABS
650.0000 mg | ORAL_TABLET | ORAL | Status: DC | PRN
  Administered 2022-03-13: 650 mg via ORAL
  Filled 2022-03-12: qty 2

## 2022-03-12 MED ORDER — PHENYLEPHRINE 80 MCG/ML (10ML) SYRINGE FOR IV PUSH (FOR BLOOD PRESSURE SUPPORT)
80.0000 ug | PREFILLED_SYRINGE | INTRAVENOUS | Status: AC | PRN
  Administered 2022-03-12 (×3): 80 ug via INTRAVENOUS
  Filled 2022-03-12: qty 10

## 2022-03-12 MED ORDER — OXYCODONE-ACETAMINOPHEN 5-325 MG PO TABS: 1.0000 | ORAL_TABLET | ORAL | Status: DC | PRN

## 2022-03-12 MED ORDER — LIDOCAINE-EPINEPHRINE (PF) 1.5 %-1:200000 IJ SOLN
INTRAMUSCULAR | Status: DC | PRN
  Administered 2022-03-12: 3 mL via EPIDURAL

## 2022-03-12 MED ORDER — SOD CITRATE-CITRIC ACID 500-334 MG/5ML PO SOLN: 30.0000 mL | ORAL | Status: DC | PRN

## 2022-03-12 MED ORDER — OXYTOCIN-SODIUM CHLORIDE 30-0.9 UT/500ML-% IV SOLN: 2.5000 [IU]/h | INTRAVENOUS | Status: DC

## 2022-03-12 MED ORDER — LACTATED RINGERS IV SOLN
500.0000 mL | Freq: Once | INTRAVENOUS | Status: AC
  Administered 2022-03-12: 500 mL via INTRAVENOUS

## 2022-03-12 MED ORDER — LACTATED RINGERS IV SOLN: INTRAVENOUS | Status: DC

## 2022-03-12 MED ORDER — FENTANYL CITRATE (PF) 100 MCG/2ML IJ SOLN
100.0000 ug | INTRAMUSCULAR | Status: DC | PRN
  Administered 2022-03-12 (×6): 100 ug via INTRAVENOUS
  Filled 2022-03-12 (×6): qty 2

## 2022-03-12 MED ORDER — PRENATAL MULTIVITAMIN CH
1.0000 | ORAL_TABLET | Freq: Every day | ORAL | Status: DC
Start: 2022-03-12 — End: 2022-03-13
  Filled 2022-03-12: qty 1

## 2022-03-12 MED ORDER — LIDOCAINE HCL (PF) 1 % IJ SOLN: 30.0000 mL | INTRAMUSCULAR | Status: DC | PRN

## 2022-03-12 MED ORDER — DIPHENHYDRAMINE HCL 50 MG/ML IJ SOLN
12.5000 mg | INTRAMUSCULAR | Status: DC | PRN
  Administered 2022-03-13: 12.5 mg via INTRAVENOUS
  Filled 2022-03-12: qty 1

## 2022-03-12 MED ORDER — PHENYLEPHRINE 80 MCG/ML (10ML) SYRINGE FOR IV PUSH (FOR BLOOD PRESSURE SUPPORT)
80.0000 ug | PREFILLED_SYRINGE | INTRAVENOUS | Status: DC | PRN
  Administered 2022-03-12: 80 ug via INTRAVENOUS

## 2022-03-12 MED ORDER — ONDANSETRON HCL 4 MG/2ML IJ SOLN
4.0000 mg | Freq: Four times a day (QID) | INTRAMUSCULAR | Status: DC | PRN
Start: 2022-03-12 — End: 2022-03-13

## 2022-03-12 MED ORDER — OXYTOCIN-SODIUM CHLORIDE 30-0.9 UT/500ML-% IV SOLN
1.0000 m[IU]/min | INTRAVENOUS | Status: DC
  Filled 2022-03-12 (×2): qty 500

## 2022-03-12 MED ORDER — EPHEDRINE 5 MG/ML INJ
10.0000 mg | INTRAVENOUS | Status: DC | PRN
  Administered 2022-03-12: 10 mg via INTRAVENOUS
  Filled 2022-03-12: qty 5

## 2022-03-12 NOTE — Progress Notes (Signed)
Labor Progress Note Angel [name] is a 31 y.o. G1P0 at [redacted]w[redacted]d presented for IOL severe FGR S:  Feeling comfortable with epidural. Currently having fetal decelerations and ok with check, AROM and IUPC  O:  BP  90s-low 100s/40s-60s Pulse (!) 143   Temp 98.2 F (36.8 C) (Oral)   Resp 18   Ht 5\' 1"  (1.549 m)   Wt 54.9 kg   LMP 06/26/2021   SpO2 100%   BMI 22.86 kg/m  EFM: 150/moderate/recurrent variable decels  CVE: Dilation: 4-4.5 Effacement (%): 60, 70 Cervical Position: Posterior Station: -3 Presentation: Vertex Exam by:: Dr. [email or web address]   A&P: 31 y.o. G1P0 [redacted]w[redacted]d presented for IOL severe FGR #Labor: patient on pitocin of 6 and having recurrent variables. Pitocin stopped and position changed and fluid bolus given. Cervix ~4-5 cm. AROM performed and IUPC placed and amnioinfusion started. Also placed FSE as hard to trace. Terbutaline also given. Improved fetal status after above mentioned resuscitation. Continue to monitor and if tracing stays reassuring can restart pitocin at 1 in one to two hours.   [redacted]w[redacted]d, MD, MPH OB Fellow, Faculty Practice

## 2022-03-12 NOTE — Progress Notes (Signed)
Labor Progress Note Angel [name] is a 31 y.o. G1P0 at [redacted]w[redacted]d presented for IOL severe FGR S:  Reports she is comfortable. At this time fetal heart rate improved.  O:  BP 107/60   Pulse 87   Temp 98.2 F (36.8 C) (Oral)   Resp 18   Ht 5\' 1"  (1.549 m)   Wt 54.9 kg   LMP 06/26/2021   SpO2 100%   BMI 22.86 kg/m  EFM: 130/accels/no decels/moderate variability (appears jagged on FSE)  CVE: Dilation: 5 Effacement (%): 70 Cervical Position: Posterior Station: -2 Presentation: Vertex Exam by:: Dr. [email or web address]   A&P: 31 y.o. G1P0 at [redacted]w[redacted]d presented for IOL severe FGR #Labor: At this time pit off for ~2 hours. Tracing Cat I. Will cautiously restart pitocin at 1 and uptitrate by 1. Discussed with patient that we are progressing very cautiously and if any other signs of distress in fetus with the pitocin we would stop the pitocin and reassess. #Pain: epidural #FWB: Cat I   [redacted]w[redacted]d, MD, MPH OB Fellow, Faculty [facility]

## 2022-03-12 NOTE — Anesthesia Procedure Notes (Signed)
Epidural Patient location during procedure: OB Start time: 03/12/2022 7:51 PM End time: 03/12/2022 8:14 PM  Staffing Anesthesiologist: Lewie Loron, MD Performed: anesthesiologist   Preanesthetic Checklist Completed: patient identified, IV checked, risks and benefits discussed, monitors and equipment checked, pre-op evaluation and timeout performed  Epidural Patient position: sitting Prep: DuraPrep and site prepped and draped Patient monitoring: heart rate, continuous pulse ox and blood pressure Approach: midline Location: L3-L4 Injection technique: LOR air and LOR saline  Needle:  Needle type: Tuohy  Needle gauge: 17 G Needle length: 9 cm Needle insertion depth: 4 cm Catheter type: closed end flexible Catheter size: 19 Gauge Catheter at skin depth: 10 cm Test dose: negative  Assessment Sensory level: T8 Events: blood not aspirated, injection not painful, no injection resistance, no paresthesia and negative IV test  Additional Notes Reason for block:procedure for pain

## 2022-03-12 NOTE — Progress Notes (Signed)
Labor Progress Note Angel [name] is a 31 y.o. G1P0 at [redacted]w[redacted]d presented for IOL d/t FGR.   S: Doing okay. Using IV fent.   O:  BP 97/64   Pulse 82   Temp 98 F (36.7 C) (Oral)   Resp 18   Ht 5\' 1"  (1.549 m)   Wt 54.9 kg   LMP 06/26/2021   BMI 22.86 kg/m  EFM: 135/mod/15x15/none  CVE: Dilation: 2 Effacement (%): 70 Cervical Position: Posterior Station: -2 Presentation: Vertex Exam by:: Dr. Annia Friendly   A&P: 31 y.o. G1P0 [redacted]w[redacted]d  #Labor: Unchanged from earlier, feels more like 2 cm. After verbal consent, placed a Cooks FB w/ 70cc without difficulty. Stopped pit to allow break since its been about 9 hours without much help quite yet. Patient desires not to have any AROM until 6-7 cm (as well as epidural).  #Pain: IV fent  #FWB: Cat I  #GBS negative   Allayne Stack, DO 2:45 PM
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
## 2022-03-13 ENCOUNTER — Encounter (HOSPITAL_COMMUNITY): Payer: Self-pay | Admitting: Obstetrics and Gynecology

## 2022-03-13 ENCOUNTER — Encounter (HOSPITAL_COMMUNITY)
Admission: RE | Disposition: A | Discharge status: Home / Self Care | Payer: Self-pay | Source: Home / Self Care | Attending: Obstetrics and Gynecology

## 2022-03-13 ENCOUNTER — Other Ambulatory Visit: Payer: Self-pay

## 2022-03-13 DIAGNOSIS — Z3A37 37 weeks gestation of pregnancy: Secondary | ICD-10-CM

## 2022-03-13 DIAGNOSIS — O9A32 Physical abuse complicating childbirth: Secondary | ICD-10-CM

## 2022-03-13 DIAGNOSIS — O365930 Maternal care for other known or suspected poor fetal growth, third trimester, not applicable or unspecified: Secondary | ICD-10-CM

## 2022-03-13 LAB — CREATININE, SERUM
Creatinine, Ser: 0.48 mg/dL (ref 0.44–1.00)
GFR, Estimated: >60 mL/min (ref >60)

## 2022-03-13 SURGERY — Surgical Case
Anesthesia: Epidural

## 2022-03-13 MED ORDER — MORPHINE SULFATE (PF) 0.5 MG/ML IJ SOLN
INTRAMUSCULAR | Status: AC
  Filled 2022-03-13: qty 10

## 2022-03-13 MED ORDER — TETANUS-DIPHTH-ACELL PERTUSSIS 5-2.5-18.5 LF-MCG/0.5 IM SUSY: 0.5000 mL | PREFILLED_SYRINGE | Freq: Once | INTRAMUSCULAR | Status: DC

## 2022-03-13 MED ORDER — LIDOCAINE-EPINEPHRINE (PF) 2 %-1:200000 IJ SOLN
INTRAMUSCULAR | Status: AC
  Filled 2022-03-13: qty 20

## 2022-03-13 MED ORDER — LIDOCAINE-EPINEPHRINE (PF) 2 %-1:200000 IJ SOLN
INTRAMUSCULAR | Status: DC | PRN
  Administered 2022-03-13: 10 mL via EPIDURAL
  Administered 2022-03-13 (×2): 2 mL via EPIDURAL

## 2022-03-13 MED ORDER — PRENATAL MULTIVITAMIN CH
1.0000 | ORAL_TABLET | Freq: Every day | ORAL | Status: DC
  Administered 2022-03-13 – 2022-03-16 (×4): 1 via ORAL
  Filled 2022-03-13 (×4): qty 1

## 2022-03-13 MED ORDER — ENOXAPARIN SODIUM 40 MG/0.4ML IJ SOSY
40.0000 mg | PREFILLED_SYRINGE | INTRAMUSCULAR | Status: DC
Start: 2022-03-14 — End: 2022-03-16
  Administered 2022-03-14 – 2022-03-16 (×3): 40 mg via SUBCUTANEOUS
  Filled 2022-03-13 (×3): qty 0.4

## 2022-03-13 MED ORDER — SIMETHICONE 80 MG PO CHEW
80.0000 mg | CHEWABLE_TABLET | Freq: Three times a day (TID) | ORAL | Status: DC
  Administered 2022-03-13 – 2022-03-16 (×9): 80 mg via ORAL
  Filled 2022-03-13 (×9): qty 1

## 2022-03-13 MED ORDER — OXYTOCIN-SODIUM CHLORIDE 30-0.9 UT/500ML-% IV SOLN
INTRAVENOUS | Status: AC
  Filled 2022-03-13: qty 500

## 2022-03-13 MED ORDER — DIPHENHYDRAMINE HCL 25 MG PO CAPS: 25.0000 mg | ORAL_CAPSULE | Freq: Four times a day (QID) | ORAL | Status: DC | PRN

## 2022-03-13 MED ORDER — OXYTOCIN-SODIUM CHLORIDE 30-0.9 UT/500ML-% IV SOLN
2.5000 [IU]/h | INTRAVENOUS | Status: AC
  Administered 2022-03-13: 2.5 [IU]/h via INTRAVENOUS
  Filled 2022-03-13: qty 500

## 2022-03-13 MED ORDER — CEFAZOLIN IN SODIUM CHLORIDE 3-0.9 GM/100ML-% IV SOLN
INTRAVENOUS | Status: AC
  Filled 2022-03-13: qty 100

## 2022-03-13 MED ORDER — ONDANSETRON HCL 4 MG/2ML IJ SOLN
INTRAMUSCULAR | Status: AC
  Filled 2022-03-13: qty 2

## 2022-03-13 MED ORDER — MORPHINE SULFATE (PF) 0.5 MG/ML IJ SOLN
INTRAMUSCULAR | Status: DC | PRN
  Administered 2022-03-13: 3 mg via EPIDURAL

## 2022-03-13 MED ORDER — CEFAZOLIN SODIUM-DEXTROSE 2-3 GM-%(50ML) IV SOLR
INTRAVENOUS | Status: DC | PRN
  Administered 2022-03-13: 2 g via INTRAVENOUS

## 2022-03-13 MED ORDER — PHENYLEPHRINE 80 MCG/ML (10ML) SYRINGE FOR IV PUSH (FOR BLOOD PRESSURE SUPPORT)
PREFILLED_SYRINGE | INTRAVENOUS | Status: DC | PRN
  Administered 2022-03-13: 80 ug via INTRAVENOUS

## 2022-03-13 MED ORDER — FENTANYL CITRATE (PF) 100 MCG/2ML IJ SOLN: 25.0000 ug | INTRAMUSCULAR | Status: DC | PRN

## 2022-03-13 MED ORDER — IBUPROFEN 600 MG PO TABS
600.0000 mg | ORAL_TABLET | Freq: Four times a day (QID) | ORAL | Status: DC
  Administered 2022-03-14 – 2022-03-16 (×11): 600 mg via ORAL
  Filled 2022-03-13 (×11): qty 1

## 2022-03-13 MED ORDER — PHENYLEPHRINE 80 MCG/ML (10ML) SYRINGE FOR IV PUSH (FOR BLOOD PRESSURE SUPPORT)
PREFILLED_SYRINGE | INTRAVENOUS | Status: AC
  Filled 2022-03-13: qty 10

## 2022-03-13 MED ORDER — ACETAMINOPHEN 500 MG PO TABS
1000.0000 mg | ORAL_TABLET | Freq: Four times a day (QID) | ORAL | Status: DC
  Administered 2022-03-13 – 2022-03-16 (×13): 1000 mg via ORAL
  Filled 2022-03-13 (×13): qty 2

## 2022-03-13 MED ORDER — DEXAMETHASONE SODIUM PHOSPHATE 10 MG/ML IJ SOLN
INTRAMUSCULAR | Status: AC
  Filled 2022-03-13: qty 1

## 2022-03-13 MED ORDER — ONDANSETRON HCL 4 MG/2ML IJ SOLN
INTRAMUSCULAR | Status: DC | PRN
  Administered 2022-03-13: 4 mg via INTRAVENOUS

## 2022-03-13 MED ORDER — PROPOFOL 10 MG/ML IV BOLUS
INTRAVENOUS | Status: AC
  Filled 2022-03-13: qty 20

## 2022-03-13 MED ORDER — SENNOSIDES-DOCUSATE SODIUM 8.6-50 MG PO TABS
2.0000 | ORAL_TABLET | Freq: Every day | ORAL | Status: DC
  Administered 2022-03-14 – 2022-03-16 (×2): 2 via ORAL
  Filled 2022-03-13 (×3): qty 2

## 2022-03-13 MED ORDER — FENTANYL CITRATE (PF) 100 MCG/2ML IJ SOLN
INTRAMUSCULAR | Status: DC | PRN
Start: 2022-03-13 — End: 2022-03-13
  Administered 2022-03-13: 100 ug via EPIDURAL

## 2022-03-13 MED ORDER — ONDANSETRON HCL 4 MG/2ML IJ SOLN
4.0000 mg | Freq: Four times a day (QID) | INTRAMUSCULAR | Status: DC | PRN
  Administered 2022-03-13: 4 mg via INTRAVENOUS
  Filled 2022-03-13: qty 2

## 2022-03-13 MED ORDER — OXYCODONE HCL 5 MG PO TABS: 5.0000 mg | ORAL_TABLET | Freq: Four times a day (QID) | ORAL | Status: DC | PRN

## 2022-03-13 MED ORDER — COCONUT OIL OIL: 1.0000 | TOPICAL_OIL | Status: DC | PRN

## 2022-03-13 MED ORDER — DEXAMETHASONE SODIUM PHOSPHATE 10 MG/ML IJ SOLN
INTRAMUSCULAR | Status: DC | PRN
  Administered 2022-03-13: 10 mg via INTRAVENOUS

## 2022-03-13 MED ORDER — DIBUCAINE (PERIANAL) 1 % EX OINT: 1.0000 | TOPICAL_OINTMENT | CUTANEOUS | Status: DC | PRN

## 2022-03-13 MED ORDER — SIMETHICONE 80 MG PO CHEW: 80.0000 mg | CHEWABLE_TABLET | ORAL | Status: DC | PRN

## 2022-03-13 MED ORDER — KETOROLAC TROMETHAMINE 30 MG/ML IJ SOLN
INTRAMUSCULAR | Status: AC
  Filled 2022-03-13: qty 1

## 2022-03-13 MED ORDER — ZOLPIDEM TARTRATE 5 MG PO TABS: 5.0000 mg | ORAL_TABLET | Freq: Every evening | ORAL | Status: DC | PRN

## 2022-03-13 MED ORDER — MENTHOL 3 MG MT LOZG
1.0000 | LOZENGE | OROMUCOSAL | Status: DC | PRN
Start: 2022-03-13 — End: 2022-03-16

## 2022-03-13 MED ORDER — KETOROLAC TROMETHAMINE 30 MG/ML IJ SOLN
30.0000 mg | Freq: Four times a day (QID) | INTRAMUSCULAR | Status: AC
  Administered 2022-03-13 (×2): 30 mg via INTRAVENOUS
  Filled 2022-03-13 (×2): qty 1

## 2022-03-13 MED ORDER — FENTANYL CITRATE (PF) 100 MCG/2ML IJ SOLN
INTRAMUSCULAR | Status: AC
  Filled 2022-03-13: qty 2

## 2022-03-13 MED ORDER — OXYTOCIN-SODIUM CHLORIDE 30-0.9 UT/500ML-% IV SOLN
INTRAVENOUS | Status: DC | PRN
  Administered 2022-03-13: 300 mL via INTRAVENOUS

## 2022-03-13 MED ORDER — WITCH HAZEL-GLYCERIN EX PADS
1.0000 | MEDICATED_PAD | CUTANEOUS | Status: DC | PRN
Start: 2022-03-13 — End: 2022-03-16

## 2022-03-13 SURGICAL SUPPLY — 31 items
BENZOIN TINCTURE PRP APPL 2/3 (GAUZE/BANDAGES/DRESSINGS) ×1 IMPLANT
CHLORAPREP W/TINT 26ML (MISCELLANEOUS) ×4 IMPLANT
CLAMP CORD UMBIL (MISCELLANEOUS) ×2 IMPLANT
CLOTH BEACON ORANGE TIMEOUT ST (SAFETY) ×2 IMPLANT
DRSG OPSITE POSTOP 4X10 (GAUZE/BANDAGES/DRESSINGS) ×2 IMPLANT
ELECT REM PT RETURN 9FT ADLT (ELECTROSURGICAL) ×2
ELECTRODE REM PT RTRN 9FT ADLT (ELECTROSURGICAL) ×1 IMPLANT
EXTRACTOR VACUUM KIWI (MISCELLANEOUS) IMPLANT
GLOVE BIO SURGEON STRL SZ 6 (GLOVE) ×2 IMPLANT
GLOVE BIOGEL PI IND STRL 7.0 (GLOVE) ×2 IMPLANT
GLOVE BIOGEL PI INDICATOR 7.0 (GLOVE) ×2
GOWN STRL REUS W/TWL LRG LVL3 (GOWN DISPOSABLE) ×4 IMPLANT
KIT ABG SYR 3ML LUER SLIP (SYRINGE) IMPLANT
NDL HYPO 25X5/8 SAFETYGLIDE (NEEDLE) IMPLANT
NEEDLE HYPO 22GX1.5 SAFETY (NEEDLE) IMPLANT
NEEDLE HYPO 25X5/8 SAFETYGLIDE (NEEDLE) IMPLANT
NS IRRIG 1000ML POUR BTL (IV SOLUTION) ×2 IMPLANT
PACK C SECTION WH (CUSTOM PROCEDURE TRAY) ×2 IMPLANT
PAD OB MATERNITY 4.3X12.25 (PERSONAL CARE ITEMS) ×2 IMPLANT
RETRACTOR WND ALEXIS 25 LRG (MISCELLANEOUS) IMPLANT
RTRCTR WOUND ALEXIS 25CM LRG (MISCELLANEOUS)
STRIP CLOSURE SKIN 1/2X4 (GAUZE/BANDAGES/DRESSINGS) ×1 IMPLANT
SUT MON AB 4-0 PS1 27 (SUTURE) ×2 IMPLANT
SUT PLAIN 0 NONE (SUTURE) ×2 IMPLANT
SUT VIC AB 0 CT1 36 (SUTURE) ×6 IMPLANT
SUT VIC AB 2-0 CT1 27 (SUTURE) ×1
SUT VIC AB 2-0 CT1 TAPERPNT 27 (SUTURE) ×1 IMPLANT
SYR CONTROL 10ML LL (SYRINGE) IMPLANT
TOWEL OR 17X24 6PK STRL BLUE (TOWEL DISPOSABLE) ×2 IMPLANT
TRAY FOLEY W/BAG SLVR 14FR LF (SET/KITS/TRAYS/PACK) IMPLANT
WATER STERILE IRR 1000ML POUR (IV SOLUTION) ×2 IMPLANT
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
## 2022-03-13 NOTE — Anesthesia Postprocedure Evaluation (Signed)
Anesthesia Post Note  Patient: Angel [name]  Procedure(s) Performed: CESAREAN SECTION     Patient location during evaluation: PACU Anesthesia Type: Epidural Level of consciousness: awake and alert Pain management: pain level controlled Vital Signs Assessment: post-procedure vital signs reviewed and stable Respiratory status: spontaneous breathing and respiratory function stable Cardiovascular status: blood pressure returned to baseline and stable Postop Assessment: epidural receding Anesthetic complications: no   No notable events documented.  Last Vitals:  Vitals:   03/13/22 0947 03/13/22 1005  BP: 111/76 115/78  Pulse: 61   Resp: 13 16  Temp:  37.2 C  SpO2: 100%     Last Pain:  Vitals:   03/13/22 1015  TempSrc:   PainSc: 0-No pain   Pain Goal: Patients Stated Pain Goal: 2 (03/12/22 0813)              Epidural/Spinal Function Cutaneous sensation: Tingles (03/13/22 1015), Patient able to flex knees: Yes (03/13/22 1015), Patient able to lift hips off bed: Yes (03/13/22 1015), Back pain beyond tenderness at insertion site: No (03/13/22 1015), Progressively worsening motor and/or sensory loss: No (03/13/22 1015), Bowel and/or bladder incontinence post epidural: No (03/13/22 1015)  Kennieth Rad

## 2022-03-13 NOTE — Plan of Care (Signed)
Called Labor and Delivery to speak to one of the providers concerning the patient. Patient was having nausea and vomiting; unable to eat. Spoke to [employer] and she ordered ondansetron (Zofran) injection 4 mg.

## 2022-03-13 NOTE — Op Note (Signed)
Angel [name] PROCEDURE DATE: 03/13/2022  PREOPERATIVE DIAGNOSES: Intrauterine pregnancy at [redacted]w[redacted]d weeks gestation; non-reassuring fetal status  POSTOPERATIVE DIAGNOSES: The same  PROCEDURE: Primary Low Transverse Cesarean Section  SURGEON:  Dr. Milas Hock  ASSISTANT:  Dr. Warner Mccreedy  ANESTHESIOLOGY TEAM: Anesthesiologist: Marcene Duos, MD; Lewie Loron, MD CRNA: Algis Greenhouse, CRNA; Orlie Pollen, CRNA  INDICATIONS: Angel [name] is a 31 y.o. G1P0 at [redacted]w[redacted]d here for cesarean section secondary to the indications listed under preoperative diagnoses; please see preoperative note for further details.  The risks of surgery were discussed with the patient including but were not limited to: bleeding which may require transfusion or reoperation; infection which may require antibiotics; injury to bowel, bladder, ureters or other surrounding organs; injury to the fetus; need for additional procedures including hysterectomy in the event of a life-threatening hemorrhage; formation of adhesions; placental abnormalities wth subsequent pregnancies; incisional problems; thromboembolic phenomenon and other postoperative/anesthesia complications.  The patient concurred with the proposed plan, giving informed written consent for the procedure.    FINDINGS:  Viable female infant in cephalic presentation.  Apgars 8 and 9.  Amniotic fluid: clear.  Intact placenta, three vessel cord.  Normal uterus, fallopian tubes and ovaries bilaterally.  ANESTHESIA: epidural INTRAVENOUS FLUIDS: 1300 ml   ESTIMATED BLOOD LOSS: 304 ml URINE OUTPUT:  300 ml SPECIMENS: Placenta sent to pathology, Arterial cord blood gas COMPLICATIONS: None immediate  PROCEDURE IN DETAIL:  She was then taken to the operating room where the epidural was dosed up to a surgical level and found to be adequate. She was then placed in a dorsal supine position with a leftward tilt, and prepped and draped in a sterile manner.  A foley  catheter was already in place.  A Pfannenstiel skin incision was made with scalpel and carried through to the underlying layer of fascia. The fascia was incised in the midline, and this incision was extended bluntly. The rectus muscles were separated in the midline and the peritoneum was entered bluntly.   The bladder blade was introduced into the abdominal cavity.  Attention was turned to the lower uterine segment where a low transverse hysterotomy was made with a scalpel and extended bilaterally bluntly.  The infant was successfully delivered, the cord was clamped and cut after one minute, and the infant was handed over to the awaiting neonatology team. Uterus was exteriorized. Uterine massage was then administered, and the placenta delivered intact with a three-vessel cord. The hysterotomy was closed with 0 Vicryl in a running fashion. Incision noted to be hemostatic. Posterior and anterior uterus thoroughly inspected.   The pelvis was cleared of all clot and debris. Hemostasis was confirmed on all surfaces.  The retractor was removed.  The peritoneum was closed with a 2-0 Vicryl running stitch. The fascia was then closed using 0 Vicryl in a running fashion.  The subcutaneous layer was irrigated and was found to be hemostatic. The skin was closed with a 4-0 Vicryl subcuticular stitch. The patient tolerated the procedure well. Sponge, instrument and needle counts were correct x 3.  She was taken to the recovery room in stable condition.   Milas Hock, MD Attending Obstetrician & Gynecologist, [facility] for [facility], [facility]

## 2022-03-13 NOTE — Plan of Care (Signed)
Went to patient's room to do 1st 4 hr check and orthostatic blood pressures. Explained to patient want was going to be done. Patient refused and asked if it could be later due to she had family members in the room. I suggested that they could leave the room or step behind the curtain to complete the task. Patient that she did not want to do that and refused for it to done at that time. Patient was explained that I (the nurse) had to complete tasks at a set time and it needed to be done. Patient still refused; stating that she had family in the room and would like to do it later.

## 2022-03-13 NOTE — Addendum Note (Signed)
Addendum  created 03/13/22 1703 by Orlie Pollen, CRNA   Clinical Note Signed

## 2022-03-13 NOTE — Progress Notes (Signed)
No change in exam by Dr. Ephriam Jenkins from prior exam.  FHT with decels with pitocin so pitocin was discontinued. She then started to contract spontaneously and FHT was reassuring. As her contractions increased in intensity and frequency she started to have late decels that were recurrent so terbutaline was given.   Recommended c-section for fetal intolerance to labor/induction in the setting of FGR<1%. Reviewed the lower reserve for babies that have FGR.   The risks of surgery were discussed with the patient including but were not limited to: bleeding which may require transfusion or reoperation; infection which may require antibiotics; injury to bowel, bladder, ureters or other surrounding organs; injury to the fetus; need for additional procedures including hysterectomy in the event of a life-threatening hemorrhage; formation of adhesions; placental abnormalities with subsequent pregnancies; incisional problems; thromboembolic phenomenon and other postoperative/anesthesia complications.    The patient had verbally consented but then started to become upset. Reviewed the surgery while recommended is not an emergency and she can take time to consider and we can return to answer any questions.   Milas Hock, MD Attending Obstetrician & Gynecologist, [facility] for [facility], [facility]

## 2022-03-13 NOTE — Progress Notes (Signed)
Labor Progress Note Angel [name] is a 31 y.o. G1P0 at [redacted]w[redacted]d presented for IOL severe FGR  Assessed patient at bedside. Having deep variables and late decelerations with every contraction. Pitocin at 1. Turned off. Cervix unchanged at 4-5 cm  Decelerations resolved with turning off pitocin. Patient still having contractions spontaneously and fetus tolerating appropriately.  - Will plan expectant management at this time and see if cervix progresses more since patient having contrations - as long as fetal status appropriate will allow for atleast 4-6 hours to allow for cervical change.  -   Warner Mccreedy, MD, MPH OB Fellow, Faculty Practice

## 2022-03-13 NOTE — Anesthesia Postprocedure Evaluation (Signed)
Anesthesia Post Note  Patient: Angel [name]  Procedure(s) Performed: CESAREAN SECTION     Patient location during evaluation: Mother Baby Anesthesia Type: Epidural Level of consciousness: awake and alert Pain management: pain level controlled Vital Signs Assessment: post-procedure vital signs reviewed and stable Respiratory status: spontaneous breathing, nonlabored ventilation and respiratory function stable Cardiovascular status: stable Postop Assessment: no headache, no backache and epidural receding Anesthetic complications: no   No notable events documented.  Last Vitals:  Vitals:   03/13/22 1204 03/13/22 1320  BP: 128/76 132/82  Pulse: 76 77  Resp: 16 17  Temp: 36.4 C 36.4 C  SpO2: 96% 96%    Last Pain:  Vitals:   03/13/22 1545  TempSrc:   PainSc: 0-No pain   Pain Goal: Patients Stated Pain Goal: 2 (03/12/22 0813)                 Marrion Coy

## 2022-03-13 NOTE — Transfer of Care (Signed)
Immediate Anesthesia Transfer of Care Note  Patient: Angel [name]  Procedure(s) Performed: CESAREAN SECTION  Patient Location: PACU  Anesthesia Type:Epidural  Level of Consciousness: awake  Airway & Oxygen Therapy: Patient Spontanous Breathing  Post-op Assessment: Report given to RN  Post vital signs: Reviewed and stable  Last Vitals:  Vitals Value Taken Time  BP 113/67 03/13/22 0831  Temp    Pulse 97 03/13/22 0836  Resp 15 03/13/22 0836  SpO2 99 % 03/13/22 0836  Vitals shown include unvalidated device data.  Last Pain:  Vitals:   03/13/22 0731  TempSrc: Oral  PainSc:       Patients Stated Pain Goal: 2 (03/12/22 0813)  Complications: No notable events documented.

## 2022-03-13 NOTE — Lactation Note (Signed)
This note was copied from a baby's chart. Lactation Consultation Note  Patient Name: Angel [name] Today's Date: 03/13/2022 Reason for consult: Initial assessment;1st time breastfeeding;Primapara;Infant < 6lbs;Early term 37-38.6wks Age:32 hours   P1 mother whose infant is now 38 hours old.  This is an early term infant at 37+1 weeks.  Mother's current feeding preference is breast/formula.  Baby weighs < 5 lbs.  RN in room assessing baby; baby taken to nursery for decreased termperature.  Reviewed breast feeding basics with parents.  Asked parents to feed "Angel [name]" at least every three hours or sooner due to gestational age and weight.  Provided the LPTI policy and reviewed in depth.  Parents had many questions.  Comprehensive amount of time spend educating and answering questions.  Suggested mother begin pumping with the DEBP; mother receptive.  Initiated the pump using the #21 flange size.  Demonstrated the manual pump.  Observed mother pumping and continued to educate.   Suggested mother call her RN/LC for latch assistance as needed.  Suggested breast feeding prior to formula supplementation.  Mother will spend no more than 5-10 minutes attempting to latch if baby is not showing interest.  Suggested lots of STS, breast massage and hand expression.  Encouraged to finger feed any expressed colostrum to "Angel [name]."    Mother does not have an electric pump for home use.  Suggested father call his insurance company now to determine eligibility.  Also considered the [facility] pump as an option.  Father found out that he is eligible to shop locally and pick out a pump with 100% coverage.  Suggested two brands, at his request.  He will research the pumps and determine which one to purchase.  Follow up tomorrow to be sure mother will have a pump available at discharge.    Grandmother present.  RN updated.   Maternal Data Has patient been taught Hand Expression?: Yes Does the patient have  breastfeeding experience prior to this delivery?: No  Feeding Mother's Current Feeding Choice: Breast Milk and Formula Nipple Type: Nfant Slow Flow (purple)  LATCH Score                    Lactation Tools Discussed/Used Tools: Pump;Flanges Flange Size: 21 Breast pump type: Double-Electric Breast Pump;Manual Pump Education: Setup, frequency, and cleaning;Milk Storage Reason for Pumping: Breast stimulation for supplementation Pumping frequency: Every three hours  Interventions Interventions: Breast feeding basics reviewed;Education;LC Services brochure  Discharge Pump: DEBP;Manual;Advised to call insurance company [facility] Program: No  Consult Status Consult Status: Follow-up Date: 03/14/22 Follow-up type: In-patient    Angel [name] 03/13/2022, 4:12 PM

## 2022-03-14 ENCOUNTER — Encounter (HOSPITAL_COMMUNITY): Payer: Self-pay | Admitting: Obstetrics and Gynecology

## 2022-03-14 LAB — CBC
HCT: 31.3 % — ABNORMAL LOW (ref 36.0–46.0)
Hemoglobin: 10.6 g/dL — ABNORMAL LOW (ref 12.0–15.0)
MCH: 31.7 pg (ref 26.0–34.0)
MCHC: 33.9 g/dL (ref 30.0–36.0)
MCV: 93.7 fL (ref 80.0–100.0)
Platelets: 208 10*3/uL (ref 150–400)
RBC: 3.34 MIL/uL — ABNORMAL LOW (ref 3.87–5.11)
RDW: 12.3 % (ref 11.5–15.5)
WBC: 16.2 10*3/uL — ABNORMAL HIGH (ref 4.0–10.5)
nRBC: 0 % (ref 0.0–0.2)

## 2022-03-14 NOTE — Lactation Note (Signed)
This note was copied from a baby's chart. Lactation Consultation Note Mom called for assistance in latching and feeding baby. Baby isn't interested in latching. LC did have to soften breast tissue before attempting to latch baby d/t edema. Reverse pressure and finger stimulation along w/hand expression collected 4 ml colostrum.  Mom used DEBP collecting 6 more ml total of 10 ml to give to baby for next feeding. While mom was pumping LC attempted to get baby to take more formula. Used white nipple. Baby gagging occasionally. LC attempted side lying position. That is when the baby gagged. LC tried purple nipple noted back splash coming out. Asked RN to get ST consult.  PLAN: Wear shells during the day Place baby to the breast after softening breast tissue Then pump q 3hr Give colostrum first then formula of total of what baby needs. Mom stated understanding. Call for assistance as needed.  Patient Name: Angel [name] TJQZE'S Date: 03/14/2022 Reason for consult: Mother's request;Difficult latch;Early term 37-38.6wks;Infant < 6lbs;Maternal endocrine disorder Age:75 hours  Maternal Data Has patient been taught Hand Expression?: Yes Does the patient have breastfeeding experience prior to this delivery?: No  Feeding Nipple Type: Nfant Standard Flow (white)  LATCH Score Latch: Too sleepy or reluctant, no latch achieved, no sucking elicited.  Audible Swallowing: None  Type of Nipple: Everted at rest and after stimulation (short shaft)  Comfort (Breast/Nipple): Filling, red/small blisters or bruises, mild/mod discomfort (edema)  Hold (Positioning): Full assist, staff holds infant at breast  LATCH Score: 3   Lactation Tools Discussed/Used Tools: Pump Flange Size: 21 Breast pump type: Double-Electric Breast Pump Pump Education: Milk Storage Reason for Pumping: less than 6 lbs Pumping frequency: q3hr Pumped volume: 10 mL  Interventions Interventions: Breast feeding basics  reviewed;Assisted with latch;Skin to skin;Breast massage;Hand express;Pre-pump if needed;Reverse pressure;Breast compression;Adjust position;Support pillows;Position options;Expressed milk;DEBP  Discharge    Consult Status Consult Status: Follow-up Date: 03/14/22 Follow-up type: In-patient    Charyl Dancer 03/14/2022, 4:23 AM

## 2022-03-14 NOTE — Lactation Note (Signed)
This note was copied from a baby's chart. Lactation Consultation Note  Patient Name: Angel [name] Today's Date: 03/14/2022   Age:31 hours  LC attempted to visit with mom, but the family was in the middle of a consult with speech. Will follow-up later.   Maternal Data    Feeding Nipple Type: Extra Slow Flow  LATCH Score                    Lactation Tools Discussed/Used    Interventions    Discharge    Consult Status      Angel [name] 03/14/2022, 1:10 PM

## 2022-03-14 NOTE — Lactation Note (Signed)
This note was copied from a baby's chart. Lactation Consultation Note  Patient Name: Angel [name] OINOM'V Date: 03/14/2022 Reason for consult: Follow-up assessment;Mother's request;Difficult latch;Primapara;1st time breastfeeding;Early term 37-38.6wks;Breastfeeding assistance;Infant < 6lbs;Infant weight loss (1.23% WL) Age:32 hours  P1, Term, Female Infant, 1.23% WL, Infant at 68 hours old  LC entered the room. Mom requested help with locating her size #21 flange. LC assisted mom with putting on coconut oil prior to pumping. LC assisted mom with turning on the pump. She states that the pump is moving very quickly. LC let mom know that the setting was a 2 and that since the flange size is a bit too big, it may be uncomfortable for her. LC encouraged mom to order a smaller flange size when she gets home for her personal pump.   Baby was cueing in the bassinet. Mom states that she is upset because they have swaddled her and she can't get her hands free. LC encouraged mom to have grandma feed baby. Grand began feeding baby formula.   LC encouraged mom to continue to pump q3hrs.   Mom states that she has no further concerns.  Maternal Data    Feeding Nipple Type: Dr. Levert Feinstein Preemie  LATCH Score                    Lactation Tools Discussed/Used Flange Size: 21  Interventions Interventions: DEBP;Education  Discharge    Consult Status Consult Status: Follow-up Date: 03/15/22 Follow-up type: In-patient    Orvil Feil Darnette Lampron 03/14/2022, 10:12 PM

## 2022-03-14 NOTE — Progress Notes (Signed)
Subjective: Postpartum Day 1: Cesarean Delivery Patient reports incisional pain and tolerating PO.    Objective: Vital signs in last 24 hours: Temp:  [97.4 F (36.3 C)-99.4 F (37.4 C)] 98.1 F (36.7 C) (06/29 0545) Pulse Rate:  [51-140] 51 (06/29 0545) Resp:  [13-18] 17 (06/29 0545) BP: (91-132)/(34-87) 91/58 (06/29 0545) SpO2:  [95 %-100 %] 95 % (06/29 0545)  Physical Exam:  General: alert, cooperative, and no distress Lochia: appropriate Uterine Fundus: firm Incision: no significant drainage, small area of old blood on dressing DVT Evaluation: No evidence of DVT seen on physical exam.  Recent Labs    03/12/22 0100 03/14/22 0516  HGB 12.7 10.6*  HCT 37.0 31.3*    Assessment/Plan: Status post Cesarean section. Doing well postoperatively.  Continue current care.  Wynelle Bourgeois, CNM 03/14/2022, 6:19 AM

## 2022-03-14 NOTE — Lactation Note (Signed)
This note was copied from a baby's chart. Lactation Consultation Note  Patient Name: Angel [name] PIRJJ'O Date: 03/14/2022 Reason for consult: Follow-up assessment;Mother's request;Difficult latch;Primapara;1st time breastfeeding;Infant weight loss;Breastfeeding assistance;Nipple pain/trauma (1.23% WL) Age:32 hours  P1, Early Term, Infant Female, <6lbs, 1.23% WL, Infant at 42 hours old  LC entered the room and baby was asleep in the bassinet. Per mom, she last pumped at 04:00 and got 32mL. She said that she would pump again this evening and tonight. LC encouraged mom to pump Q 3hrs and feed her expressed milk to baby. LC also spoke with mom about supply and demand.  Mom states that she feels some pain when pumping. LC suggested putting coconut oil in the flanges to assist with making pumping less painful. Mom also states that the [facility] from yesterday told her that her nipples are small and she may need a flange smaller than a 21. LC encouraged mom to purchase smaller flanges online.   Mom states that she would like to have someone from lactation come and assist her with putting baby to the breast. LC encouraged mom to call for latch assistance when she is ready.   Mom states that she is about to pump soon.   Feeding Nipple Type: Extra Slow Flow   Interventions Interventions: Breast feeding basics reviewed;Education  Discharge    Consult Status Consult Status: Follow-up Date: 03/15/22 Follow-up type: In-patient    Delene Loll 03/14/2022, 1:35 PM

## 2022-03-14 NOTE — Social Work (Addendum)
CSW acknowledged consult and attempted to meet with MOB.   CSW attempted to meet with MOB however MOB was in the shower. CSW will meet with MOB at a  later time.    Update: CSW acknowledged consult and completed a clinical assessment.  There are no barriers to d/c.  Clinical assessment notes will be entered at a later time.  Angel [name], MSW, LCSW [facility]  Clinical Social Worker  [phone] 03/14/2022  4:26 PM   

## 2022-03-14 NOTE — Lactation Note (Signed)
This note was copied from a baby's chart. Lactation Consultation Note  Patient Name: Angel [name] ASNKN'L Date: 03/14/2022 Reason for consult: Follow-up assessment;Mother's request;Difficult latch;Primapara;1st time breastfeeding;Early term 37-38.6wks;Infant < 6lbs;Infant weight loss;Breastfeeding assistance (1.23%WL) Age:32 hours  P1, Early Term, Infant Female, <6lbs, 1.23%WL, Infant at 52 hours old  LC entered the room and mom states that she needs assistance with pumping. Mom states that the pump has been causing her pain on level 3. LC helped mom with pumping, but the pump was not working on the left breast. LC checked all parts and the tubing appeared to have some water or condensation. After attempting to blow air through, LC got mom new tubing. We turned the pump back on and the left side still was not working. LC replaced the pump. Mom was able to pump a few drops on the right breast.   Mom states that this was the first time she has pumped since last night and is frustrated due to low production. LC spoke with mom about supply and demand and encouraged her to aim to pump every 3 hours.   Mom asked LC to assist her with hand expression. LC was able to hand express 2 mL from the left breast and spoon feed it to baby. Mom, dad, and grandma were shown how to hand express.   LC assisted mom with putting baby to her left breast in the cross cradle hold. Baby took a few sucks and fell asleep. LC encouraged mom to continue to work on the latch, but try to keep feedings the total feeding time less than 30 min. If baby is not latching after a few attempts, feed her expressed milk or donor milk via the bottle.   Mom states that she has no further concerns.   Maternal Data    Feeding Nipple Type: Dr. Levert Feinstein Preemie  LATCH Score                    Lactation Tools Discussed/Used    Interventions Interventions: Assisted with latch;Breast feeding basics reviewed;Breast  massage;Hand express;Breast compression;Adjust position;Support pillows;Expressed milk;Education  Discharge    Consult Status Consult Status: Follow-up Date: 03/15/22    Delene Loll 03/14/2022, 8:34 PM

## 2022-03-15 ENCOUNTER — Other Ambulatory Visit: Payer: Managed Care, Other (non HMO)

## 2022-03-15 ENCOUNTER — Ambulatory Visit: Payer: Managed Care, Other (non HMO)

## 2022-03-15 LAB — SURGICAL PATHOLOGY

## 2022-03-15 NOTE — Progress Notes (Signed)
Post Operative Day 2  Subjective: Doing well. No acute events overnight. Pain is controlled and bleeding is appropriate. She is eating, drinking, voiding, and ambulating without issue. She is passing flatus and has had a bowel movement. She is breast and formula feeding which is going well. She has no other concerns at this time.  Objective: Blood pressure 106/63, pulse 66, temperature 98.3 F (36.8 C), temperature source Oral, resp. rate 16, height 5\' 1"  (1.549 m), weight 54.9 kg, last menstrual period 06/26/2021, SpO2 100 %, unknown if currently breastfeeding.  Physical Exam:  General: alert, cooperative, and no distress Lochia: appropriate Uterine Fundus: firm and below umbilicus  Incision: healing well, no significant drainage, dressing is clean, dry, and intact  DVT Evaluation: no LE edema or calf tenderness to palpation   Recent Labs    03/14/22 0516  HGB 10.6*  HCT 31.3*    Assessment/Plan: Angel [name] is a 31 y.o. G1P1001 on POD# 2 s/p pLTCS.  Progressing well. Meeting postpartum milestones. VSS. Continue routine postpartum care.  #Acute blood loss anemia: - Hgb 12.7 > 10.6 post-operatively - No signs/symptoms of anemia - Will continue prenatal vitamin with iron   #DV: - Appreciate social work consult   Feeding: Breast and formula  Contraception: Undecided, counseled at bedside. Will plan to discuss at postpartum visit.   Dispo: Plan for discharge on POD#3.    LOS: 3 days   38, MD  03/15/2022, 7:20 AM

## 2022-03-15 NOTE — Social Work (Signed)
CSW received consult for  Edinburgh Depression Screen.  CSW receives and acknowledges consult for EDPS 10.    CSW provided education regarding Baby Blues vs PMADs and provided MOB with resources for mental health follow up. CSW encouraged MOB to evaluate her mental health throughout the postpartum period with the use of the New Mom Checklist developed by Postpartum Progress as well as the [place] Postnatal Depression Scale and notify a medical professional if symptoms arise. This CSW called to do check in with RN, Deven and MOB presentation is stable with no concerns after CSW visit yesterday.      Angel [name], MSW, LCSW [facility]  Clinical Social Worker  [phone] 03/15/2022  1:44 PM
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
## 2022-03-15 NOTE — Plan of Care (Signed)
Storkpump form emailed to [employer], verified by phone call that it was received.

## 2022-03-16 ENCOUNTER — Ambulatory Visit: Payer: Self-pay

## 2022-03-16 MED ORDER — OXYCODONE HCL 5 MG PO TABS: 5.0000 mg | ORAL_TABLET | Freq: Four times a day (QID) | ORAL | 0 refills | Status: DC | PRN

## 2022-03-16 MED ORDER — IBUPROFEN 600 MG PO TABS
600.0000 mg | ORAL_TABLET | Freq: Four times a day (QID) | ORAL | 0 refills | Status: DC | PRN
Start: 2022-03-16 — End: 2022-10-07

## 2022-03-16 NOTE — Lactation Note (Signed)
This note was copied from a baby's chart. Lactation Consultation Note Mom feeding baby EBM in Dr. Irving Burton bottle. Mom asked about different ways to burp baby that she is having a hard time burping baby. LC demonstrated several ways. Praised mom for all the BM she has.  Encouraged to cont. Pumping every 3 hrs.  Patient Name: Girl Sydney Hasten XOVAN'V Date: 03/16/2022 Reason for consult: Follow-up assessment;Primapara;Early term 37-38.6wks;Infant < 6lbs Age:100 days  Maternal Data    Feeding Mother's Current Feeding Choice: Breast Milk Nipple Type: Dr. Levert Feinstein Preemie  LATCH Score                    Lactation Tools Discussed/Used Tools: Pump Breast pump type: Double-Electric Breast Pump  Interventions    Discharge    Consult Status Consult Status: Follow-up Date: 03/17/22 Follow-up type: In-patient    Charyl Dancer 03/16/2022, 11:48 PM

## 2022-03-17 ENCOUNTER — Ambulatory Visit: Payer: Self-pay

## 2022-03-17 NOTE — Lactation Note (Signed)
This note was copied from a baby's chart. Lactation Consultation Note  Patient Name: Girl Terree Gaultney MGQQP'Y Date: 03/17/2022 Reason for consult: Follow-up assessment;Mother's request;Difficult latch;Primapara;1st time breastfeeding;Early term 37-38.6wks;Infant < 6lbs;Breastfeeding assistance Age:32 days  P1, Early Term, Female Infant, 0.99% WG  Infant is at 4 days of life. Per mom she pumped 76mL of breast milk and has been feeding baby her expressed milk. Mom states that she was not told about milk storage and thought that she could only pump fresh milk and feed it to baby. She states that she had not been pumping frequently and consistently because she was not aware that she could store milk in the fridge, but she will pump on schedule now.   Mom states that she is starting to feel fullness in her breasts.   LC reviewed milk storage guidelines, engorgement, warning signs, infant I/O, and pumping frequency with mom.   LC also spoke with mom about supply and demand and praised her for all of her efforts.   LC spoke with mom about feeding guidelines and making sure that baby is eating enough.  Mom states that she will call for lactation support if needed when she returns home.   Interventions Interventions: Breast feeding basics reviewed;Education;LPT handout/interventions  Discharge Discharge Education: Engorgement and breast care;Warning signs for feeding baby;Outpatient recommendation  Consult Status Consult Status: Complete Follow-up type: Call as needed    Delene Loll 03/17/2022, 12:35 PM

## 2022-03-17 NOTE — Lactation Note (Signed)
This note was copied from a baby's chart. Lactation Consultation Note  Patient Name: Angel [name] Today's Date: 03/17/2022   Age:32 days  LC attempted to visit with the family, but there was a provider in the room. LC will follow up with mom at a later time.    [name] P Constantin Hillery 03/17/2022, 11:14 AM

## 2022-03-18 ENCOUNTER — Telehealth (HOSPITAL_COMMUNITY): Payer: Self-pay | Admitting: *Deleted

## 2022-03-18 ENCOUNTER — Encounter: Payer: Managed Care, Other (non HMO) | Admitting: Obstetrics and Gynecology

## 2022-03-18 DIAGNOSIS — Z1331 Encounter for screening for depression: Secondary | ICD-10-CM

## 2022-03-18 NOTE — Telephone Encounter (Signed)
Inpatient epds score was 10. SW consult completed and now ambulatory IBH referral made. Dr. Crissie Reese notified via chart.  Duffy Rhody, RN 03-18-2022 at 1:36pm

## 2022-03-25 NOTE — Progress Notes (Unsigned)
   GYNECOLOGY OFFICE VISIT NOTE  History:   Angel [name] is a 31 y.o. G1P1001 here today for incision check following her c-section.    She denies any abnormal vaginal discharge, bleeding, pelvic pain or other concerns.  The following portions of the patient's history were reviewed and updated as appropriate: allergies, current medications, past family history, past medical history, past social history, past surgical history and problem list.   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 She needs repap and HPV testing at [facility] visit  Review of Systems:  Pertinent items noted in HPI and remainder of comprehensive ROS otherwise negative.  Physical Exam:  There were no vitals taken for this visit. CONSTITUTIONAL: Well-developed, well-nourished female in no acute distress.  HEENT:  Normocephalic, atraumatic. External right and left ear normal. No scleral icterus.  NECK: Normal range of motion, supple, no masses noted on observation SKIN: No rash noted. Not diaphoretic. No erythema. No pallor. MUSCULOSKELETAL: Normal range of motion. No edema noted. NEUROLOGIC: Alert and oriented to person, place, and time. Normal muscle tone coordination. No cranial nerve deficit noted. PSYCHIATRIC: Normal mood and affect. Normal behavior. Normal judgment and thought content.  CARDIOVASCULAR: Normal heart rate noted RESPIRATORY: Effort and breath sounds normal, no problems with respiration noted ABDOMEN: No masses noted. No other overt distention noted.  Incision c/d/I ***  PELVIC: Deferred  Assessment and Plan:   1. Postop check Healing well, no concerns   Reviewed need for cotesting at [facility] visit. ***  Routine preventative health maintenance measures emphasized. Please refer to After Visit Summary for other counseling recommendations.   No follow-ups on file.  Milas Hock, MD,  FACOG Obstetrician & Gynecologist, [facility] for [facility], [facility]

## 2022-03-26 ENCOUNTER — Encounter: Payer: Managed Care, Other (non HMO) | Admitting: Advanced Practice Midwife

## 2022-03-27 ENCOUNTER — Encounter: Payer: Self-pay | Admitting: Obstetrics and Gynecology

## 2022-03-27 ENCOUNTER — Ambulatory Visit (INDEPENDENT_AMBULATORY_CARE_PROVIDER_SITE_OTHER): Payer: Managed Care, Other (non HMO) | Admitting: Obstetrics and Gynecology

## 2022-03-27 VITALS — BP 98/66 | HR 89 | Wt 107.0 lb

## 2022-03-27 DIAGNOSIS — Z09 Encounter for follow-up examination after completed treatment for conditions other than malignant neoplasm: Secondary | ICD-10-CM

## 2022-04-01 ENCOUNTER — Telehealth: Payer: Self-pay | Admitting: Clinical

## 2022-04-01 NOTE — Telephone Encounter (Signed)
Attempt to follow up and schedule regarding referral; Left HIPPA-compliant message to call back Asher Muir from [employer] for [employer] at [facility] at  [phone] [facility] office).

## 2022-04-02 ENCOUNTER — Encounter: Payer: Managed Care, Other (non HMO) | Admitting: Advanced Practice Midwife
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
## 2022-04-17 ENCOUNTER — Telehealth: Payer: Self-pay
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
## 2022-04-17 NOTE — Telephone Encounter (Signed)
Called number and answering machine recording is in different language. Didn't leave message. Armandina Stammer  RN

## 2022-04-23 ENCOUNTER — Encounter: Payer: Self-pay | Admitting: Advanced Practice Midwife

## 2022-04-23 ENCOUNTER — Ambulatory Visit (INDEPENDENT_AMBULATORY_CARE_PROVIDER_SITE_OTHER): Payer: Managed Care, Other (non HMO) | Admitting: Advanced Practice Midwife

## 2022-04-23 DIAGNOSIS — R87612 Low grade squamous intraepithelial lesion on cytologic smear of cervix (LGSIL): Secondary | ICD-10-CM

## 2022-04-23 DIAGNOSIS — T7491XD Unspecified adult maltreatment, confirmed, subsequent encounter: Secondary | ICD-10-CM
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
## 2022-04-29 ENCOUNTER — Ambulatory Visit (INDEPENDENT_AMBULATORY_CARE_PROVIDER_SITE_OTHER): Payer: Managed Care, Other (non HMO) | Admitting: Clinical

## 2022-04-29 DIAGNOSIS — Z658 Other specified problems related to psychosocial circumstances: Secondary | ICD-10-CM

## 2022-04-29 DIAGNOSIS — Z7189 Other specified counseling: Secondary | ICD-10-CM

## 2022-04-29 NOTE — Patient Instructions (Addendum)
Center for [facility] at [facility] for Women [address] [place], [place] [address] [phone] (main office) [phone] [facility] office)  New Parent Support Groups www.[email or web address].net [email or web address]  [place] [employer]  (Childcare options, Early childcare development, etc.) [email or web address].cz  [employer] [email or web address]:  [address], 2nd floor, [place], [place] [address] [phone])  Main line [phone]  [place] location **Parking is available in the [address] parking deck.  [place]:  [address], [place], [place] [address] [phone]) Main line [phone]  [place] location  **Located on the backside of the [facility]. Limited parking is available off of [address]  [place] hours: Monday -Friday 8:30am-4:30pm [email or web address]

## 2022-05-01 NOTE — BH Specialist Note (Unsigned)
Pt did not arrive to video visit and did not answer the phone; Left HIPPA-compliant message to call back Maycee Blasco from [facility] at [facility] at  [phone] (Ardith Lewman's office).  ?; left MyChart message for patient.  ? ?

## 2022-05-13 ENCOUNTER — Ambulatory Visit: Payer: Managed Care, Other (non HMO) | Admitting: Family Medicine

## 2022-05-15 ENCOUNTER — Ambulatory Visit: Payer: Managed Care, Other (non HMO) | Admitting: Clinical

## 2022-05-15 DIAGNOSIS — Z91199 Patient's noncompliance with other medical treatment and regimen due to unspecified reason: Secondary | ICD-10-CM

## 2022-05-21 ENCOUNTER — Ambulatory Visit: Payer: Managed Care, Other (non HMO) | Admitting: Clinical

## 2022-05-21 DIAGNOSIS — Z91199 Patient's noncompliance with other medical treatment and regimen due to unspecified reason: Secondary | ICD-10-CM

## 2022-05-21 NOTE — BH Specialist Note (Signed)
Pt did not arrive to video visit and did not answer the phone; Left HIPPA-compliant message to call back Eldonna Neuenfeldt from [facility] at [place] [facility] at  [phone] (Breanna Shorkey's office).  ?; left MyChart message for patient.  ? ?

## 2022-05-30 ENCOUNTER — Ambulatory Visit (INDEPENDENT_AMBULATORY_CARE_PROVIDER_SITE_OTHER): Payer: 59 | Admitting: Clinical

## 2022-05-30 DIAGNOSIS — Z7189 Other specified counseling: Secondary | ICD-10-CM | POA: Diagnosis not present

## 2022-05-30 DIAGNOSIS — Z658 Other specified problems related to psychosocial circumstances: Secondary | ICD-10-CM
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
## 2022-06-06 ENCOUNTER — Ambulatory Visit: Payer: Managed Care, Other (non HMO) | Admitting: Family Medicine
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
## 2022-06-22 ENCOUNTER — Encounter (HOSPITAL_COMMUNITY): Payer: Self-pay | Admitting: Emergency Medicine

## 2022-06-22 ENCOUNTER — Emergency Department (HOSPITAL_COMMUNITY)
Admission: EM | Admit: 2022-06-22 | Discharge: 2022-06-22 | Discharge status: Against Medical Advice | Payer: Managed Care, Other (non HMO) | Attending: Emergency Medicine | Admitting: Emergency Medicine

## 2022-06-22 ENCOUNTER — Other Ambulatory Visit: Payer: Self-pay

## 2022-06-22 DIAGNOSIS — Z5321 Procedure and treatment not carried out due to patient leaving prior to being seen by health care provider: Secondary | ICD-10-CM | POA: Diagnosis not present

## 2022-06-22 DIAGNOSIS — M545 Low back pain, unspecified: Secondary | ICD-10-CM | POA: Diagnosis not present

## 2022-06-22 DIAGNOSIS — R1030 Lower abdominal pain, unspecified: Secondary | ICD-10-CM | POA: Diagnosis present

## 2022-06-22 NOTE — ED Triage Notes (Signed)
Patient here 3 months post c-section, complaining of lower abdominal pain, describes it as intense cramping. Also complaining of lower back pain. Patient also complains that incision from c section is also painful. Pt is on her menstrual cycle currently and stating the bleeding is heavy.

## 2022-06-22 NOTE — ED Notes (Signed)
Pt no answer for room. Pt seen leaving ED earlier by other staff member.

## 2022-06-22 NOTE — ED Notes (Signed)
Pt asking about pain medication she can take while breast feeding.

## 2022-06-27 ENCOUNTER — Ambulatory Visit (INDEPENDENT_AMBULATORY_CARE_PROVIDER_SITE_OTHER): Payer: 59 | Admitting: Clinical

## 2022-06-27 DIAGNOSIS — Z7189 Other specified counseling: Secondary | ICD-10-CM | POA: Diagnosis not present

## 2022-06-27 DIAGNOSIS — Z658 Other specified problems related to psychosocial circumstances: Secondary | ICD-10-CM

## 2022-06-27 NOTE — Patient Instructions (Signed)
Center [facility] at [facility] [facility] [address] [place], [place] [address] [phone] (main office) [phone] (Tehran Rabenold's office)  New Parent Support Groups [email or web address].net [email or web address]   

## 2022-10-04 ENCOUNTER — Encounter: Payer: Self-pay | Admitting: Emergency Medicine

## 2022-10-04 ENCOUNTER — Ambulatory Visit
Admission: EM | Admit: 2022-10-04 | Discharge: 2022-10-04 | Disposition: A | Discharge status: Home / Self Care | Payer: Managed Care, Other (non HMO) | Attending: Physician Assistant | Admitting: Physician Assistant

## 2022-10-04 DIAGNOSIS — J069 Acute upper respiratory infection, unspecified: Secondary | ICD-10-CM

## 2022-10-04 LAB — POC SARS CORONAVIRUS 2 AG -  ED: SARS Coronavirus 2 Ag: NEGATIVE

## 2022-10-04 NOTE — ED Triage Notes (Signed)
Patient states that her infant daughter was diagnosed with COVID this am.  Patient is having pain in her throat and lower abdomen.  Patient denies any OTC pain meds.

## 2022-10-04 NOTE — Discharge Instructions (Signed)
Return if any problems.
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
## 2022-10-07 ENCOUNTER — Ambulatory Visit
Admission: EM | Admit: 2022-10-07 | Discharge: 2022-10-07 | Disposition: A | Discharge status: Home / Self Care | Payer: Managed Care, Other (non HMO)

## 2022-10-07 DIAGNOSIS — U071 COVID-19: Secondary | ICD-10-CM
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
## 2022-10-07 NOTE — Discharge Instructions (Addendum)
Your COVID-19 test is positive.  You do not need to to repeat the COVID-19 test.  There are no recommendations to test for cure from COVID-19 either.  Patient is continue to test positive for up to 6 weeks even though their symptoms have completely resolved.  This is why testing for cure is not reliable.  I have provided you with a note to give to your public defender letting them know that you have COVID-19.  I recommend that you requested postponement of your court date by at least 1 week.  Thank you for visiting urgent care today.

## 2022-10-08 ENCOUNTER — Telehealth: Payer: Self-pay | Admitting: Emergency Medicine

## 2022-10-08 NOTE — Telephone Encounter (Signed)
Call to patient to see how she was today - pt has some back pain - encouraged to continue w/ tylenol & ibuprofen for pain. Pt thanked Therapist, sports for the call.

## 2022-12-09 ENCOUNTER — Ambulatory Visit: Payer: Managed Care, Other (non HMO) | Admitting: Family Medicine

## 2022-12-13 ENCOUNTER — Ambulatory Visit
Admission: EM | Admit: 2022-12-13 | Discharge: 2022-12-13 | Discharge status: Absent without leave | Payer: Managed Care, Other (non HMO)

## 2022-12-19 ENCOUNTER — Ambulatory Visit (INDEPENDENT_AMBULATORY_CARE_PROVIDER_SITE_OTHER): Payer: Managed Care, Other (non HMO) | Admitting: Family Medicine

## 2022-12-19 VITALS — BP 91/49 | HR 79 | Ht 61.0 in | Wt 98.0 lb

## 2022-12-19 DIAGNOSIS — R87612 Low grade squamous intraepithelial lesion on cytologic smear of cervix (LGSIL): Secondary | ICD-10-CM

## 2022-12-19 DIAGNOSIS — T7491XD Unspecified adult maltreatment, confirmed, subsequent encounter: Secondary | ICD-10-CM

## 2022-12-19 DIAGNOSIS — R634 Abnormal weight loss: Secondary | ICD-10-CM | POA: Diagnosis not present

## 2022-12-19 DIAGNOSIS — Z23 Encounter for immunization: Secondary | ICD-10-CM

## 2022-12-19 NOTE — Progress Notes (Signed)
Reports menses 12/02/22 x 5 days. Then VB again 12/12/22 X 3 days. Low temperature and weight loss reported with 2nd episode of VB. No current method of birth control. Not sexually active.
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
## 2023-01-23 ENCOUNTER — Ambulatory Visit
Admission: EM | Admit: 2023-01-23 | Discharge: 2023-01-23 | Disposition: A | Discharge status: Home / Self Care | Payer: Managed Care, Other (non HMO) | Attending: Family Medicine | Admitting: Family Medicine

## 2023-01-23 DIAGNOSIS — J029 Acute pharyngitis, unspecified: Secondary | ICD-10-CM | POA: Diagnosis present

## 2023-01-23 DIAGNOSIS — R52 Pain, unspecified: Secondary | ICD-10-CM | POA: Insufficient documentation

## 2023-01-23 LAB — POCT RAPID STREP A (OFFICE): Rapid Strep A Screen: NEGATIVE

## 2023-01-23 LAB — POCT INFLUENZA A/B
Influenza A, POC: NEGATIVE
Influenza B, POC: NEGATIVE

## 2023-01-23 LAB — POC SARS CORONAVIRUS 2 AG -  ED: SARS Coronavirus 2 Ag: NEGATIVE
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
## 2023-01-23 NOTE — ED Triage Notes (Addendum)
Pt presents to uc with co of sore throat, cough, congestion with generalized body aches for one week. Pt reports trouble sleeping.

## 2023-01-23 NOTE — Discharge Instructions (Addendum)
Advised patient of test results today-rapid strep, Influenza, and COVID-19 all negative.  Advised may use OTC Tylenol 500 to 1000 mg every 6 hours for fever generalized pain or sore throat.  Encouraged increase daily water intake to 64 ounces per day while taking this medication.  Advised if symptoms worsen please follow-up with PCP.  Advised patient may establish care with [facility] primary care at [facility] [address] [place], [place] [address] [phone]

## 2023-01-25 LAB — CULTURE, GROUP A STREP (THRC)

## 2023-01-26 LAB — CULTURE, GROUP A STREP (THRC)

## 2023-02-20 ENCOUNTER — Other Ambulatory Visit (HOSPITAL_COMMUNITY)
Admission: RE | Admit: 2023-02-20 | Discharge: 2023-02-20 | Disposition: A | Discharge status: Home / Self Care | Payer: Managed Care, Other (non HMO) | Source: Ambulatory Visit | Attending: Family Medicine | Admitting: Family Medicine

## 2023-02-20 ENCOUNTER — Ambulatory Visit: Payer: Managed Care, Other (non HMO) | Admitting: Family Medicine

## 2023-02-20 ENCOUNTER — Encounter: Payer: Self-pay | Admitting: Family Medicine

## 2023-02-20 VITALS — BP 103/68 | HR 80 | Wt 97.0 lb

## 2023-02-20 DIAGNOSIS — Z23 Encounter for immunization: Secondary | ICD-10-CM

## 2023-02-20 DIAGNOSIS — Z01419 Encounter for gynecological examination (general) (routine) without abnormal findings: Secondary | ICD-10-CM | POA: Insufficient documentation

## 2023-02-20 DIAGNOSIS — Z1339 Encounter for screening examination for other mental health and behavioral disorders: Secondary | ICD-10-CM

## 2023-02-20 NOTE — Progress Notes (Signed)
ANNUAL EXAM Patient name: Angel [name] MRN [id number]  Date of birth: 11-May-1991 Chief Complaint:   Annual Exam  History of Present Illness:   Angel [name] is a 32 y.o.  [id number]  female  being seen today for a routine annual exam.  Current complaints: none  Patient's last menstrual period was 01/31/2023 (exact date).    Last pap 09/20/2021. Results were: LSIL w/ HRHPV positive: type not specified.      02/20/2023   11:52 AM 03/05/2022    9:46 AM 09/20/2021    1:30 PM 08/14/2021    4:01 PM  Depression screen PHQ 2/9  Decreased Interest 0  2 0  Down, Depressed, Hopeless 1 0 1 0  PHQ - 2 Score 1 0 3 0  Altered sleeping 1 0 2   Tired, decreased energy 1 2 2    Change in appetite 1 1 3    Feeling bad or failure about yourself  1 3 2    Trouble concentrating 0 1 0   Moving slowly or fidgety/restless 1 1 1    Suicidal thoughts 0 0 0   PHQ-9 Score 6 8 13          02/20/2023   11:53 AM 03/05/2022    9:47 AM 09/20/2021    1:30 PM  GAD 7 : Generalized Anxiety Score  Nervous, Anxious, on Edge 1 2 1   Control/stop worrying 1 1 1   Worry too much - different things 1 1 1   Trouble relaxing 1 1 1   Restless 2 1 0  Easily annoyed or irritable 0 1 1  Afraid - awful might happen 1 3 2   Total GAD 7 Score 7 10 7      Review of Systems:   Pertinent items are noted in HPI Denies any headaches, blurred vision, fatigue, shortness of breath, chest pain, abdominal pain, abnormal vaginal discharge/itching/odor/irritation, problems with periods, bowel movements, urination, or intercourse unless otherwise stated above. Pertinent History Reviewed:  Reviewed past medical,surgical, social and family history.  Reviewed problem list, medications and allergies. Physical Assessment:   Vitals:   02/20/23 1117  BP: 103/68  Pulse: 80  Weight: 97 lb (44 kg)  Body mass index is 18.33 kg/m.        Physical Examination:   General appearance - well appearing, and in no distress  Mental status - alert,  oriented to person, place, and time  Psych:  She has a normal mood and affect  Skin - warm and dry, normal color, no suspicious lesions noted  Chest - effort normal, all lung fields clear to auscultation bilaterally  Heart - normal rate and regular rhythm  Neck:  midline trachea, no thyromegaly or nodules  Breasts - breasts appear normal, no suspicious masses, no skin or nipple changes or axillary nodes  Abdomen - soft, nontender, nondistended, no masses or organomegaly  Pelvic - VULVA: normal appearing vulva with no masses, tenderness or lesions  VAGINA: normal appearing vagina with normal color and discharge, no lesions  CERVIX: normal appearing cervix without discharge or lesions, no CMT  Thin prep pap is done with HR HPV cotesting  UTERUS: uterus is felt to be normal size, shape, consistency and nontender   ADNEXA: No adnexal masses or tenderness noted.  Extremities:  No swelling or varicosities noted  Chaperone present for exam  Assessment & Plan:  1. Need for HPV vaccine - HPV 9-valent vaccine,Recombinat  2. Well woman exam with routine gynecological exam Repeat PAP - Cytology - PAP( [facility])  Labs/procedures today:  Orders Placed This Encounter  Procedures   HPV 9-valent vaccine,Recombinat    Meds: No orders of the defined types were placed in this encounter.   Follow-up: No follow-ups on file.  Levie Heritage, DO 02/20/2023 12:01 PM

## 2023-02-21 ENCOUNTER — Ambulatory Visit: Payer: Managed Care, Other (non HMO) | Admitting: Family Medicine

## 2023-02-27 LAB — CYTOLOGY - PAP
Comment: NEGATIVE
Diagnosis: NEGATIVE
High risk HPV: NEGATIVE

## 2023-02-28 ENCOUNTER — Telehealth: Payer: Self-pay

## 2023-02-28 NOTE — Telephone Encounter (Signed)
-----   Message from Levie Heritage, DO sent at 02/27/2023  9:37 AM EDT ----- Needs a PAP in 1 year

## 2023-02-28 NOTE — Telephone Encounter (Signed)
Left message for patient to return call to office. Tarren Velardi  RN 

## 2023-03-04 ENCOUNTER — Ambulatory Visit (INDEPENDENT_AMBULATORY_CARE_PROVIDER_SITE_OTHER): Payer: Managed Care, Other (non HMO) | Admitting: Family Medicine

## 2023-03-04 ENCOUNTER — Encounter: Payer: Self-pay | Admitting: Family Medicine

## 2023-03-04 VITALS — BP 100/70 | HR 90 | Temp 98.3°F | Resp 12 | Ht 61.0 in | Wt 96.4 lb

## 2023-03-04 DIAGNOSIS — T7491XA Unspecified adult maltreatment, confirmed, initial encounter: Secondary | ICD-10-CM

## 2023-03-04 DIAGNOSIS — T7411XA Adult physical abuse, confirmed, initial encounter: Secondary | ICD-10-CM
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
## 2023-03-05 ENCOUNTER — Encounter: Payer: Self-pay | Admitting: Family Medicine

## 2023-03-14 ENCOUNTER — Encounter: Payer: Self-pay | Admitting: Family Medicine

## 2023-03-14 ENCOUNTER — Telehealth (INDEPENDENT_AMBULATORY_CARE_PROVIDER_SITE_OTHER): Payer: Managed Care, Other (non HMO) | Admitting: Family Medicine

## 2023-03-14 DIAGNOSIS — N926 Irregular menstruation, unspecified: Secondary | ICD-10-CM

## 2023-03-14 NOTE — Assessment & Plan Note (Signed)
Only 1x If it con't to happen-- pt will f/u ob/gyn

## 2023-03-14 NOTE — Progress Notes (Signed)
MyChart Video Visit    Virtual Visit via Video Note   This patient is at least at moderate risk for complications without adequate follow up. This format is felt to be most appropriate for this patient at this time. Physical exam was limited by quality of the video and audio technology used for the visit. Angel [name] was able to get the patient set up on a video visit.  Patient location: Home  Patient and provider in visit Provider location: Office  I discussed the limitations of evaluation and management by telemedicine and the availability of in person appointments. The patient expressed understanding and agreed to proceed.  Visit Date: 03/14/2023  Today's healthcare provider: Donato Schultz, DO     Subjective:    Patient ID: Angel [name], female    DOB: April 21, 1991, 32 y.o.   MRN: [id number]  Chief Complaint  Patient presents with   Menstrual Problem    HPI Discussed the use of AI scribe software for clinical note transcription with the patient, who gave verbal consent to proceed.  History of Present Illness   The patient, a new mother, presents with a recent change in menstrual cycle. She reports a three-day period followed by a break and then spotting. She describes the spotting as 'just like a few drops.' She denies any similar episodes in the past and reports a normal period last month. She is currently breastfeeding her one-year-old child and is not on any form of birth control. She denies any other symptoms or concerns.      Past Medical History:  Diagnosis Date   Kidney stones    PCOS (polycystic ovarian syndrome)     Past Surgical History:  Procedure Laterality Date   APPENDECTOMY  2012   in [place]   CESAREAN SECTION N/A 03/13/2022   Procedure: CESAREAN SECTION;  Surgeon: Milas Hock, MD;  Location: [facility];  Service: Obstetrics;  Laterality: N/A;   Kidney stone removal  2021   and 2014   RHINOPLASTY      Family History  Problem Relation Age of  Onset   Hypertension Mother    Kidney Stones Mother    Hypertension Father    Kidney Stones Father    Thyroid disease Sister    Seizures Sister    Polycystic ovary syndrome Sister     Social History   Socioeconomic History   Marital status: Legally Separated    Spouse name: Not on file   Number of children: Not on file   Years of education: Not on file   Highest education level: Not on file  Occupational History   Not on file  Tobacco Use   Smoking status: Never   Smokeless tobacco: Never  Vaping Use   Vaping Use: Never used  Substance and Sexual Activity   Alcohol use: No   Drug use: No   Sexual activity: Not Currently    Birth control/protection: None  Other Topics Concern   Not on file  Social History Narrative   Not on file   Social Determinants of Health   Financial Resource Strain: Not on file  Food Insecurity: Not on file  Transportation Needs: Not on file  Physical Activity: Not on file  Stress: Not on file  Social Connections: Not on file  Intimate Partner Violence: Not on file    Outpatient Medications Prior to Visit  Medication Sig Dispense Refill   Prenatal Vit-Fe Fumarate-FA (PRENATAL MULTIVITAMIN) TABS tablet Take 1 tablet by mouth daily  at 12 noon.     No facility-administered medications prior to visit.    Allergies  Allergen Reactions   Levaquin [Levofloxacin In D5w] Hives    Review of Systems  Constitutional:  Negative for fever and malaise/fatigue.  HENT:  Negative for congestion.   Eyes:  Negative for blurred vision.  Respiratory:  Negative for shortness of breath.   Cardiovascular:  Negative for chest pain, palpitations and leg swelling.  Gastrointestinal:  Negative for abdominal pain, blood in stool and nausea.  Genitourinary:  Negative for dysuria and frequency.  Musculoskeletal:  Negative for falls.  Skin:  Negative for rash.  Neurological:  Negative for dizziness, loss of consciousness and headaches.  Endo/Heme/Allergies:   Negative for environmental allergies.  Psychiatric/Behavioral:  Negative for depression. The patient is not nervous/anxious.        Objective:    Physical Exam Vitals and nursing note reviewed.  Constitutional:      General: She is not in acute distress.    Appearance: Normal appearance.  Eyes:     General: No scleral icterus.       Right eye: No discharge.        Left eye: No discharge.  Pulmonary:     Effort: Pulmonary effort is normal. No respiratory distress.  Neurological:     General: No focal deficit present.     Mental Status: She is alert. Mental status is at baseline. She is disoriented.  Psychiatric:        Mood and Affect: Mood normal.        Behavior: Behavior normal.        Thought Content: Thought content normal.        Judgment: Judgment normal.     LMP 03/03/2023 (Exact Date)  Wt Readings from Last 3 Encounters:  03/04/23 96 lb 6.4 oz (43.7 kg)  02/20/23 97 lb (44 kg)  12/19/22 98 lb (44.5 kg)       Assessment & Plan:  Irregular periods Assessment & Plan: Only 1x If it con't to happen-- pt will f/u ob/gyn      I discussed the assessment and treatment plan with the patient. The patient was provided an opportunity to ask questions and all were answered. The patient agreed with the plan and demonstrated an understanding of the instructions.   The patient was advised to call back or seek an in-person evaluation if the symptoms worsen or if the condition fails to improve as anticipated.  Donato Schultz, DO [facility] [phone] (phone) [phone] (fax)  [facility]

## 2023-04-02 ENCOUNTER — Encounter: Payer: Self-pay | Admitting: Family Medicine

## 2023-04-25 ENCOUNTER — Ambulatory Visit: Payer: Managed Care, Other (non HMO)

## 2023-06-07 ENCOUNTER — Inpatient Hospital Stay (HOSPITAL_COMMUNITY)
Admission: EM | Admit: 2023-06-07 | Discharge: 2023-06-20 | DRG: 885 | Disposition: A | Discharge status: Home / Self Care | Payer: 59 | Source: Intra-hospital | Attending: Psychiatry | Admitting: Psychiatry

## 2023-06-07 ENCOUNTER — Emergency Department (HOSPITAL_BASED_OUTPATIENT_CLINIC_OR_DEPARTMENT_OTHER): Payer: 59

## 2023-06-07 ENCOUNTER — Encounter (HOSPITAL_BASED_OUTPATIENT_CLINIC_OR_DEPARTMENT_OTHER): Payer: Self-pay

## 2023-06-07 ENCOUNTER — Other Ambulatory Visit: Payer: Self-pay

## 2023-06-07 ENCOUNTER — Emergency Department (EMERGENCY_DEPARTMENT_HOSPITAL)
Admission: EM | Admit: 2023-06-07 | Discharge: 2023-06-07 | Disposition: A | Discharge status: Other Acute Inpatient Hospital | Payer: 59 | Source: Home / Self Care | Attending: Emergency Medicine | Admitting: Emergency Medicine

## 2023-06-07 ENCOUNTER — Encounter (HOSPITAL_COMMUNITY): Payer: Self-pay | Admitting: Adult Health

## 2023-06-07 DIAGNOSIS — N390 Urinary tract infection, site not specified: Secondary | ICD-10-CM | POA: Diagnosis present

## 2023-06-07 DIAGNOSIS — F329 Major depressive disorder, single episode, unspecified: Principal | ICD-10-CM | POA: Diagnosis present

## 2023-06-07 DIAGNOSIS — R072 Precordial pain: Secondary | ICD-10-CM | POA: Diagnosis present

## 2023-06-07 DIAGNOSIS — Z87442 Personal history of urinary calculi: Secondary | ICD-10-CM | POA: Diagnosis not present

## 2023-06-07 DIAGNOSIS — F323 Major depressive disorder, single episode, severe with psychotic features: Secondary | ICD-10-CM | POA: Diagnosis present

## 2023-06-07 DIAGNOSIS — K30 Functional dyspepsia: Secondary | ICD-10-CM | POA: Diagnosis present

## 2023-06-07 DIAGNOSIS — E876 Hypokalemia: Secondary | ICD-10-CM | POA: Diagnosis present

## 2023-06-07 DIAGNOSIS — R079 Chest pain, unspecified: Secondary | ICD-10-CM

## 2023-06-07 DIAGNOSIS — F333 Major depressive disorder, recurrent, severe with psychotic symptoms: Secondary | ICD-10-CM

## 2023-06-07 DIAGNOSIS — F419 Anxiety disorder, unspecified: Secondary | ICD-10-CM

## 2023-06-07 DIAGNOSIS — Z8249 Family history of ischemic heart disease and other diseases of the circulatory system: Secondary | ICD-10-CM | POA: Diagnosis not present

## 2023-06-07 DIAGNOSIS — Z91148 Patient's other noncompliance with medication regimen for other reason: Secondary | ICD-10-CM

## 2023-06-07 DIAGNOSIS — Z5941 Food insecurity: Secondary | ICD-10-CM | POA: Diagnosis not present

## 2023-06-07 DIAGNOSIS — Z9151 Personal history of suicidal behavior: Secondary | ICD-10-CM

## 2023-06-07 DIAGNOSIS — K5900 Constipation, unspecified: Secondary | ICD-10-CM | POA: Diagnosis present

## 2023-06-07 DIAGNOSIS — T7491XA Unspecified adult maltreatment, confirmed, initial encounter: Secondary | ICD-10-CM | POA: Diagnosis present

## 2023-06-07 DIAGNOSIS — T7411XA Adult physical abuse, confirmed, initial encounter: Secondary | ICD-10-CM | POA: Diagnosis present

## 2023-06-07 DIAGNOSIS — G4700 Insomnia, unspecified: Secondary | ICD-10-CM | POA: Diagnosis present

## 2023-06-07 DIAGNOSIS — F339 Major depressive disorder, recurrent, unspecified: Secondary | ICD-10-CM | POA: Insufficient documentation

## 2023-06-07 DIAGNOSIS — F332 Major depressive disorder, recurrent severe without psychotic features: Secondary | ICD-10-CM

## 2023-06-07 DIAGNOSIS — Z635 Disruption of family by separation and divorce: Secondary | ICD-10-CM

## 2023-06-07 DIAGNOSIS — R000 Tachycardia, unspecified: Secondary | ICD-10-CM | POA: Diagnosis present

## 2023-06-07 DIAGNOSIS — Z8349 Family history of other endocrine, nutritional and metabolic diseases: Secondary | ICD-10-CM | POA: Diagnosis not present

## 2023-06-07 LAB — CBG MONITORING, ED: Glucose-Capillary: 121 mg/dL — ABNORMAL HIGH (ref 70–99)

## 2023-06-07 LAB — CBC WITH DIFFERENTIAL/PLATELET
Abs Immature Granulocytes: 0.01 10*3/uL (ref 0.00–0.07)
Basophils Absolute: 0.1 10*3/uL (ref 0.0–0.1)
Basophils Relative: 1 %
Eosinophils Absolute: 0.1 10*3/uL (ref 0.0–0.5)
Eosinophils Relative: 1 %
HCT: 37.7 % (ref 36.0–46.0)
Hemoglobin: 12.5 g/dL (ref 12.0–15.0)
Immature Granulocytes: 0 %
Lymphocytes Relative: 41 %
Lymphs Abs: 2.5 10*3/uL (ref 0.7–4.0)
MCH: 30.3 pg (ref 26.0–34.0)
MCHC: 33.2 g/dL (ref 30.0–36.0)
MCV: 91.3 fL (ref 80.0–100.0)
Monocytes Absolute: 0.4 10*3/uL (ref 0.1–1.0)
Monocytes Relative: 7 %
Neutro Abs: 3 10*3/uL (ref 1.7–7.7)
Neutrophils Relative %: 50 %
Platelets: 283 10*3/uL (ref 150–400)
RBC: 4.13 MIL/uL (ref 3.87–5.11)
RDW: 11.8 % (ref 11.5–15.5)
WBC: 6.1 10*3/uL (ref 4.0–10.5)
nRBC: 0 % (ref 0.0–0.2)

## 2023-06-07 LAB — BASIC METABOLIC PANEL
Anion gap: 13 (ref 5–15)
BUN: 8 mg/dL (ref 6–20)
CO2: 22 mmol/L (ref 22–32)
Calcium: 9.1 mg/dL (ref 8.9–10.3)
Chloride: 103 mmol/L (ref 98–111)
Creatinine, Ser: 0.58 mg/dL (ref 0.44–1.00)
GFR, Estimated: >60 mL/min (ref >60)
Glucose, Bld: 92 mg/dL (ref 70–99)
Potassium: 3.2 mmol/L — ABNORMAL LOW (ref 3.5–5.1)
Sodium: 138 mmol/L (ref 135–145)

## 2023-06-07 LAB — TROPONIN I (HIGH SENSITIVITY): Troponin I (High Sensitivity): 2 ng/L (ref <18)

## 2023-06-07 LAB — HCG, QUANTITATIVE, PREGNANCY: hCG, Beta Chain, Quant, S: <1 m[IU]/mL (ref <5)

## 2023-06-07 MED ORDER — ALUM & MAG HYDROXIDE-SIMETH 200-200-20 MG/5ML PO SUSP
30.0000 mL | ORAL | Status: DC | PRN
  Administered 2023-06-16 – 2023-06-18 (×2): 30 mL via ORAL
  Filled 2023-06-07 (×3): qty 30

## 2023-06-07 MED ORDER — LORAZEPAM 1 MG PO TABS
2.0000 mg | ORAL_TABLET | Freq: Three times a day (TID) | ORAL | Status: DC | PRN
  Filled 2023-06-07: qty 2

## 2023-06-07 MED ORDER — OLANZAPINE 5 MG PO TBDP: 5.0000 mg | ORAL_TABLET | Freq: Once | ORAL | Status: DC

## 2023-06-07 MED ORDER — LORAZEPAM 2 MG/ML IJ SOLN
2.0000 mg | Freq: Three times a day (TID) | INTRAMUSCULAR | Status: DC | PRN
  Administered 2023-06-08 – 2023-06-09 (×2): 2 mg via INTRAMUSCULAR
  Filled 2023-06-07 (×2): qty 1

## 2023-06-07 MED ORDER — MAGNESIUM HYDROXIDE 400 MG/5ML PO SUSP: 30.0000 mL | Freq: Every day | ORAL | Status: DC | PRN

## 2023-06-07 MED ORDER — HALOPERIDOL LACTATE 5 MG/ML IJ SOLN
5.0000 mg | Freq: Three times a day (TID) | INTRAMUSCULAR | Status: DC | PRN
  Administered 2023-06-08 – 2023-06-09 (×2): 5 mg via INTRAMUSCULAR
  Filled 2023-06-07 (×2): qty 1

## 2023-06-07 MED ORDER — DIPHENHYDRAMINE HCL 50 MG/ML IJ SOLN
50.0000 mg | Freq: Three times a day (TID) | INTRAMUSCULAR | Status: DC | PRN
  Administered 2023-06-08 – 2023-06-09 (×2): 50 mg via INTRAMUSCULAR
  Filled 2023-06-07 (×2): qty 1

## 2023-06-07 MED ORDER — OLANZAPINE 5 MG PO TABS
5.0000 mg | ORAL_TABLET | Freq: Every day | ORAL | Status: DC
  Filled 2023-06-07: qty 1

## 2023-06-07 MED ORDER — ACETAMINOPHEN 325 MG PO TABS
650.0000 mg | ORAL_TABLET | Freq: Four times a day (QID) | ORAL | Status: DC | PRN
  Administered 2023-06-16 – 2023-06-19 (×2): 650 mg via ORAL
  Filled 2023-06-07 (×2): qty 2

## 2023-06-07 MED ORDER — POTASSIUM CHLORIDE CRYS ER 20 MEQ PO TBCR
40.0000 meq | EXTENDED_RELEASE_TABLET | Freq: Once | ORAL | Status: AC
  Administered 2023-06-07: 40 meq via ORAL
  Filled 2023-06-07: qty 2

## 2023-06-07 MED ORDER — HALOPERIDOL 5 MG PO TABS
5.0000 mg | ORAL_TABLET | Freq: Three times a day (TID) | ORAL | Status: DC | PRN
  Filled 2023-06-07: qty 1

## 2023-06-07 MED ORDER — DIPHENHYDRAMINE HCL 25 MG PO CAPS
50.0000 mg | ORAL_CAPSULE | Freq: Three times a day (TID) | ORAL | Status: DC | PRN
  Filled 2023-06-07: qty 2

## 2023-06-07 NOTE — ED Notes (Addendum)
Non-verbal response when asked questions  Rise and fall of chest breathing observed

## 2023-06-07 NOTE — ED Notes (Signed)
Patient is quieter and not excessively talking

## 2023-06-07 NOTE — ED Provider Notes (Signed)
Patient's heart rate went up to 120 and she felt hot.  Rectal temp was obtained and this was normal.  She is awake and alert.  She is denying any complaints.  Her heart rate went back down to around 100.  On chart review, it has been running in the 90s.  Sheriff's department is here to transport to [facility].   Rolan Bucco, MD 06/07/23 1946

## 2023-06-07 NOTE — ED Notes (Addendum)
Warm milk Warm vanilla ensure  Hot water

## 2023-06-07 NOTE — ED Notes (Signed)
Report to Vikki Ports, RN at [facility]  Aware that IVC is still in process and transport will be delayed.

## 2023-06-07 NOTE — ED Notes (Signed)
Verified IVC papers received by Magistrate.

## 2023-06-07 NOTE — ED Notes (Signed)
Patients belonging and valuables sent with sheriff along with IVC and transfer paperwork. Patient transported to [facility] via sheriff.

## 2023-06-07 NOTE — ED Notes (Signed)
DSS in touch with child's father and he sent friends to pick up child.  DSS approved friends to take child and will be in contact with family for further evaluation and investigation.

## 2023-06-07 NOTE — ED Notes (Signed)
Patient cried when on the toilet Patient stated that she had to bare down hard to get her urine to stream While ambulating patient stated that both legs were heavy and hurting When patient is laying down legs not heavy or hurting
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
## 2023-06-07 NOTE — Tx Team (Signed)
Initial Treatment Plan 06/07/2023 10:38 PM Angel [name] ZOX:[id number]    PATIENT STRESSORS: Other: Patient currently feeling anxious and overwhelmed with caring for her daughter and herself     PATIENT STRENGTHS: Ability for insight  Average or above average intelligence  Supportive family/friends    PATIENT IDENTIFIED PROBLEMS: Anxiety  Depression                   DISCHARGE CRITERIA:  Improved stabilization in mood, thinking, and/or behavior  PRELIMINARY DISCHARGE PLAN: Return to previous living arrangement  PATIENT/FAMILY INVOLVEMENT: This treatment plan has been presented to and reviewed with the patient, Angel [name], and/or family member.  The patient and family have been given the opportunity to ask questions and make suggestions.  Floyce Stakes, RN 06/07/2023, 10:38 PM

## 2023-06-07 NOTE — ED Notes (Addendum)
Patient HR noted to be elevated ranging from 100-120. Denies any complaints. Patients heart rate returned to 80-90's. Rectal temp 98.3. MD Belfi assessed patient and cleared to go to [facility]. Update given to Belfair at [facility].

## 2023-06-07 NOTE — ED Notes (Signed)
Called DSS per nurse request.

## 2023-06-07 NOTE — ED Notes (Signed)
Excessive talking  Frequent burping

## 2023-06-07 NOTE — ED Notes (Signed)
TTS speaking with pt at this time.

## 2023-06-07 NOTE — ED Notes (Signed)
IV removed Patient dressed in purple scrubs Patient wanded by security
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
## 2023-06-07 NOTE — ED Notes (Signed)
Patient sleeping
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
## 2023-06-07 NOTE — ED Notes (Signed)
Warm milk Vanilla Ensure

## 2023-06-07 NOTE — ED Notes (Addendum)
Attempted to give pt zyprexa, she was not responding to verbal commands. CBG was 121 and vitals were updated. Will attempt to give meds when pt wakes up.

## 2023-06-07 NOTE — ED Notes (Signed)
Lupita Leash from CPS speaking to primary RN at nursing station
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
## 2023-06-07 NOTE — ED Notes (Signed)
Pt and child escorted back to room by charge RN, NT placed in room with family to insure pt and child safety.

## 2023-06-07 NOTE — ED Notes (Addendum)
Excessive talking  Frequent burping

## 2023-06-07 NOTE — ED Notes (Addendum)
Patient stated she heard clinging bells in her head as well as seeing blue lights when sleeping Patient is not experiencing sound or visual changes currently at this time

## 2023-06-07 NOTE — ED Notes (Signed)
IVC papers faxed to Magistrate by secretary, unable to submit online

## 2023-06-07 NOTE — ED Notes (Signed)
CPS talking to patient at bedside. Was asked to step out.

## 2023-06-07 NOTE — ED Triage Notes (Signed)
Pt ambulated to room one c/o midsternal chest pain that started two days ago. Pt reports pain is "cramping and heavy". Pt tearful in triage.

## 2023-06-07 NOTE — ED Notes (Signed)
Security wanded patient

## 2023-06-07 NOTE — ED Notes (Signed)
Called [place] dispatch for transport at 3:36.

## 2023-06-07 NOTE — ED Notes (Signed)
Sitting at patient bedside.
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
## 2023-06-08 DIAGNOSIS — F323 Major depressive disorder, single episode, severe with psychotic features: Principal | ICD-10-CM

## 2023-06-08 MED ORDER — OLANZAPINE 5 MG PO TABS
5.0000 mg | ORAL_TABLET | Freq: Every day | ORAL | Status: DC
  Filled 2023-06-08 (×3): qty 1

## 2023-06-08 MED ORDER — OLANZAPINE 5 MG PO TABS
5.0000 mg | ORAL_TABLET | Freq: Once | ORAL | Status: AC
  Administered 2023-06-08: 5 mg via ORAL
  Filled 2023-06-08 (×2): qty 1

## 2023-06-08 MED ORDER — HYDROXYZINE HCL 25 MG PO TABS
25.0000 mg | ORAL_TABLET | ORAL | Status: DC | PRN
  Administered 2023-06-09 – 2023-06-19 (×6): 25 mg via ORAL
  Filled 2023-06-08 (×9): qty 1
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
## 2023-06-08 NOTE — Plan of Care (Signed)
  Problem: Education: Goal: Emotional status will improve Outcome: Not Progressing Goal: Mental status will improve Outcome: Not Progressing   

## 2023-06-08 NOTE — Progress Notes (Addendum)
Patient ID: Angel [name], female   DOB: 11-Jan-1991, 32 y.o.   MRN: [id number] Patient is extremely agitated, screaming at staff, and then screaming on the phone. She is pacing the hallway and then coming to scream at staff about having her daughter and being in the hospital. Attempts at verbal redirection becoming increasingly difficult as the patient continues to argue with staff when attempts are made to verbally de-escalate. Pt offered prn medications and refused. Pt accusing writer '' I'm going to go to my room but you do the right thing, and stop thinking that women are nothing! I would be a queen in my [place] ''  Pt verbally redirected. Will con't to monitor. Pt remains in care of primary RN.
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
## 2023-06-08 NOTE — [facility] Group Notes (Addendum)
[facility] Group Notes:  (Nursing/MHT/Case Management/Adjunct)  Date:  06/08/2023  Time:  2:28 PM  Type of Therapy:  Psychoeducational Skills  Participation Level:  Active  Participation Quality:  disorganized  Affect:  labile  Cognitive:  disorganized  Insight:  improved  Engagement in Group:  engaged  Modes of Intervention:  Discussion, Education, and Exploration  Summary of Progress/Problems:  Mental health wellness podcast played from [place], regarding tips for promoting mental well being and healthy relationships.Pt attended and shared appropriately but thought content was tangential and somewhat disorganized.

## 2023-06-08 NOTE — Plan of Care (Signed)
  Problem: Activity: Goal: Sleeping patterns will improve Outcome: Progressing   Problem: Safety: Goal: Periods of time without injury will increase Outcome: Progressing   Problem: Health Behavior/Discharge Planning: Goal: Compliance with therapeutic regimen will improve Outcome: Progressing   Problem: Pain Managment: Goal: General experience of comfort will improve Outcome: Progressing   Problem: Safety: Goal: Ability to remain free from injury will improve Outcome: Progressing

## 2023-06-08 NOTE — Progress Notes (Signed)
CSW asked to speak with pt regarding her child.  Pt quite agitated, raising her voice, stating she is the mother and is worried about her child.  Per epic notes, pt was picked up from ED by a friend of the child's father.  Father not allowed to have contact due to restraining order.  CSW spoke with Pollyann Samples, listed on facesheet. He confirmed that he is not allowed to have contact with pt or with the child, named Nicolette Bang, age 33 months, due to the 77B.  He has spoken to DSS worker Pia Mau, [phone].  He was told DSS will follow up with him next week.  His brother and sister in law currently have the child: [place] and Shilpa.  Uday cell: [phone].    CSW spoke with Pia Mau, DSS.  [phone].  DSS does not have custody of this child, voluntary placement, they did ask that any contact with the mother be supervised by [place] and Shilpa.  Per Lupita Leash, there is nothing that prohibits mother from contact but Uday and Shilpa were a little nervous about this situation, should be careful about how contact occurs if mother is not stable.  DSS will be following up this week.  Daleen Squibb, MSW, LCSW 9/22/202410:42 AM

## 2023-06-08 NOTE — Progress Notes (Signed)
  On assessment, patient is observed in her bed with eyes closed.  Patient is asleep.  Patient shows no signs of distress. Writer will continue to monitor patient.  Patient is safe on the unit with q15 minute safety checks.

## 2023-06-08 NOTE — Progress Notes (Signed)
Patient noted to be agitated and disorganized, rambling and preoccupied. Pt continue to state, " I want to see my child now".  Pt noted to be loud and escalating. Pt declined PRN medication for anxiety stating, " I just need some hot water and that will calm me". Same provided to patient. Pt transferred to room 504 for continuation of care. Report given to covering RN. Pt calmly escorted to room 504. Safety maintained.  06/08/23 0830  Psych Admission Type (Psych Patients Only)  Admission Status Voluntary  Psychosocial Assessment  Patient Complaints Anxiety;Worrying;Sadness  Eye Contact Brief  Facial Expression Anxious;Sad;Worried  Affect Anxious;Blunted;Depressed;Irritable  Speech Logical/coherent;Pressured  Interaction Assertive  Motor Activity Restless  Appearance/Hygiene In scrubs  Behavior Characteristics Anxious;Guarded;Irritable  Mood Anxious;Depressed;Preoccupied  Thought Process  Coherency Disorganized  Content Blaming others;Preoccupation  Delusions None reported or observed  Perception WDL  Hallucination None reported or observed  Judgment Poor  Confusion Mild  Danger to Self  Current suicidal ideation? Denies  Danger to Others  Danger to Others None reported or observed

## 2023-06-08 NOTE — Progress Notes (Signed)
Pt remains agitated on unit, yelling, laying on floor, ideas of reference noted. All attempts of verbal deescalation and to help pt calm self unsuccessful. Agitation protocol administered at this time.

## 2023-06-08 NOTE — Progress Notes (Signed)
   06/08/23 0558  15 Minute Checks  Location Bedroom  Visual Appearance Calm  Behavior Sleeping  Sleep (Behavioral Health Patients Only)  Calculate sleep? (Click Yes once per 24 hr at 0600 safety check) Yes  Documented sleep last 24 hours 7.25

## 2023-06-08 NOTE — [facility] Group Notes (Signed)
Pt did not attend goals group. 

## 2023-06-08 NOTE — [facility] Group Notes (Signed)
Pt did not attend wrap-up group   

## 2023-06-09 ENCOUNTER — Encounter (HOSPITAL_COMMUNITY): Payer: Self-pay

## 2023-06-09 DIAGNOSIS — G4700 Insomnia, unspecified: Secondary | ICD-10-CM | POA: Diagnosis present

## 2023-06-09 DIAGNOSIS — F323 Major depressive disorder, single episode, severe with psychotic features: Secondary | ICD-10-CM | POA: Diagnosis not present

## 2023-06-09 LAB — COMPREHENSIVE METABOLIC PANEL
ALT: 17 U/L (ref 0–44)
AST: 22 U/L (ref 15–41)
Albumin: 4.4 g/dL (ref 3.5–5.0)
Alkaline Phosphatase: 57 U/L (ref 38–126)
Anion gap: 10 (ref 5–15)
BUN: 13 mg/dL (ref 6–20)
CO2: 23 mmol/L (ref 22–32)
Calcium: 9.1 mg/dL (ref 8.9–10.3)
Chloride: 105 mmol/L (ref 98–111)
Creatinine, Ser: 0.64 mg/dL (ref 0.44–1.00)
GFR, Estimated: >60 mL/min (ref >60)
Glucose, Bld: 78 mg/dL (ref 70–99)
Potassium: 3.5 mmol/L (ref 3.5–5.1)
Sodium: 138 mmol/L (ref 135–145)
Total Bilirubin: 0.7 mg/dL (ref 0.3–1.2)
Total Protein: 7.8 g/dL (ref 6.5–8.1)

## 2023-06-09 LAB — LIPID PANEL
Cholesterol: 147 mg/dL (ref 0–200)
HDL: 47 mg/dL (ref >40)
LDL Cholesterol: 89 mg/dL (ref 0–99)
Total CHOL/HDL Ratio: 3.1 RATIO
Triglycerides: 57 mg/dL (ref <150)
VLDL: 11 mg/dL (ref 0–40)

## 2023-06-09 LAB — TSH: TSH: 3.066 u[IU]/mL (ref 0.350–4.500)

## 2023-06-09 LAB — HEMOGLOBIN A1C
Hgb A1c MFr Bld: 4.8 % (ref 4.8–5.6)
Mean Plasma Glucose: 91.06 mg/dL

## 2023-06-09 MED ORDER — OLANZAPINE 10 MG IM SOLR
5.0000 mg | Freq: Two times a day (BID) | INTRAMUSCULAR | Status: DC
  Filled 2023-06-09 (×6): qty 10

## 2023-06-09 MED ORDER — LORAZEPAM 2 MG/ML IJ SOLN: 1.0000 mg | Freq: Three times a day (TID) | INTRAMUSCULAR | Status: DC | PRN

## 2023-06-09 MED ORDER — LORAZEPAM 1 MG PO TABS
1.0000 mg | ORAL_TABLET | Freq: Three times a day (TID) | ORAL | Status: DC | PRN
  Administered 2023-06-12: 1 mg via ORAL
  Filled 2023-06-09: qty 1

## 2023-06-09 MED ORDER — OLANZAPINE 5 MG PO TBDP: 5.0000 mg | ORAL_TABLET | Freq: Every day | ORAL | Status: DC | PRN

## 2023-06-09 MED ORDER — OLANZAPINE 5 MG PO TBDP
5.0000 mg | ORAL_TABLET | Freq: Two times a day (BID) | ORAL | Status: DC
  Administered 2023-06-09 – 2023-06-10 (×2): 5 mg via ORAL
  Filled 2023-06-09 (×8): qty 1

## 2023-06-09 MED ORDER — OLANZAPINE 10 MG IM SOLR: 5.0000 mg | Freq: Every day | INTRAMUSCULAR | Status: DC | PRN

## 2023-06-09 NOTE — [facility] Group Notes (Signed)
Pt did not  attend wrap up group discussion

## 2023-06-09 NOTE — Plan of Care (Signed)
  Problem: Activity: Goal: Interest or engagement in activities will improve Outcome: Progressing Goal: Sleeping patterns will improve Outcome: Progressing   Problem: Safety: Goal: Periods of time without injury will increase Outcome: Progressing   Problem: Education: Goal: Emotional status will improve Outcome: Not Progressing Goal: Mental status will improve Outcome: Not Progressing   Problem: Coping: Goal: Ability to demonstrate self-control will improve Outcome: Not Progressing   Problem: Self-Concept: Goal: Level of anxiety will decrease Outcome: Not Progressing
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
## 2023-06-09 NOTE — Progress Notes (Signed)
Pt seen for second opinion for forced medication/medication over her objection, requested by her psychiatrist DR. Nadir Attiah.   Chart reviewed, notes reviewed, labs, meds reviewed, pt seen and d/w Dr. Abbott Pao. patient refused night dose of Zyprexa last night and had to be given agitation protocol Haldol Ativan and Benadryl yesterday at 3 PM and again this morning at 8:30 AM pt reportedly was  hyper paranoid tangential and delusional, with disorganized and agitated verbally and physically not redirectable verbally.  Per H & P note-Kenli Coopersmith, 32 y.o., female patient who is married, but has a 50B order in place so currently separated from her husband. She initially presented to the emergency department with her 76 month old toddler for evaluation of chest pain. While in the ED patient appeared detached from her child and did not have appropriate hygiene items to care for her. Since there is a no contact order in place, DSS was contacted for assistance in placement of the baby. Additionally, a psychiatric consult was entered to determine mental health contribution to her presentation.   On assessment - pt lying in bed, makes limited eye contact, anxious, guarded and hyper verbal, disorganized tangential speech. She does not answers appropriately but rambles about her child not being taken care of by her husband and family, appears paranoid, believes " they " are after her and her kid, denies that she needs mental health care/tx, denies need of psych meds, poor insight and judgement, states she will not take any psych meds because she does not need it.  Reason for the Medication: The patient, without the benefit of the specific treatment measure, is incapable of participating in any available treatment plan that will give the patient a realistic opportunity of improving the patient's condition.   Consideration of Side Effects: Consideration of the side effects related to the medication plan has been given.    Rationale for Medication Administration: Patient has shown improvement in past hospitalizations with medication management.  She decompensates after discharge when she becomes medication noncompliant.      At the present time, patient has no capacity to understand the need for treatment, refusing to take psychotropic medication, because of poor insight and judgment.  Patient states that they have no problem, no mental illness and refuses to consider taking medication, recommended by the psychiatrist Dr. Abbott Pao.  Diagnosis:  Major depressive disorder, single episode, severe with psychotic features (HCC    Treatment plan/Opinion: -patient is mentally ill, as needs hospitalization -patient continues to display active symptoms of psychosis- paranoid, disorganized thought, speech and behavior [mental illness] -patient lacks the capacity to consent to treatment with medication   -patient is unable to rationally discuss medication options, risks versus benefit due to their symptoms. -treatment with medication and medication changes would be in their best interest -psychotropics are effective treatment for symptoms of psychosis   Based on my evaluation, the following medications are recommend and indicated for treatment of the patient's psychiatric diagnosis and symptoms: Zyprexa po  or IM if refused po as recommended by her provider[Rx recommended]  . Plan to monitor QTc

## 2023-06-09 NOTE — [facility] Group Notes (Signed)
Adult Psychoeducational Group Note  Date:  06/09/2023 Time:  10:11 AM  Group Topic/Focus:  Goals Group:   The focus of this group is to help patients establish daily goals to achieve during treatment and discuss how the patient can incorporate goal setting into their daily lives to aide in recovery. Orientation:   The focus of this group is to educate the patient on the purpose and policies of crisis stabilization and provide a format to answer questions about their admission.  The group details unit policies and expectations of patients while admitted.  Participation Level:  Did Not Attend  Participation Quality:    Affect:    Cognitive:    Insight:   Engagement in Group:    Modes of Intervention:    Additional Comments:    Sheran Lawless 06/09/2023, 10:11 AM

## 2023-06-09 NOTE — Plan of Care (Signed)
Problem: Education: Goal: Emotional status will improve Outcome: Not Progressing Goal: Mental status will improve Outcome: Not Progressing

## 2023-06-09 NOTE — Progress Notes (Signed)
Pt preoccupied about seeing her child, writer tried to explain that pt is on forced medication order, pt appeared to not listen , but pt finally took PO medications.     06/09/23 2200  Psych Admission Type (Psych Patients Only)  Admission Status Voluntary  Psychosocial Assessment  Patient Complaints Anxiety;Agitation;Irritability  Eye Contact Suspiciousness  Facial Expression Worried  Affect Anxious;Angry  Speech Argumentative;Pressured  Interaction Defensive;Demanding;Intrusive  Motor Activity Restless;Fidgety  Appearance/Hygiene Improved  Behavior Characteristics Agressive verbally  Mood Suspicious;Preoccupied  Aggressive Behavior  Effect No apparent injury  Thought Process  Coherency Circumstantial;Disorganized;Tangential  Content Blaming others;Preoccupation  Delusions None reported or observed  Perception Derealization  Hallucination None reported or observed  Judgment Poor  Confusion Mild  Danger to Self  Current suicidal ideation? Denies

## 2023-06-09 NOTE — Progress Notes (Signed)
   06/09/23 0600  Psych Admission Type (Psych Patients Only)  Admission Status Voluntary  Psychosocial Assessment  Patient Complaints Anxiety;Sadness;Worrying  Eye Contact Fair  Facial Expression Anxious;Worried  Affect Depressed  Speech Logical/coherent;Rapid  Interaction Assertive  Motor Activity Slow  Appearance/Hygiene In scrubs  Behavior Characteristics Anxious;Irritable  Mood Depressed;Anxious;Preoccupied  Thought Process  Coherency Disorganized  Content Preoccupation;Blaming self  Delusions None reported or observed  Perception WDL  Hallucination None reported or observed  Judgment Poor  Confusion Mild  Danger to Self  Current suicidal ideation? Denies  Danger to Others  Danger to Others None reported or observed

## 2023-06-09 NOTE — Progress Notes (Signed)
   06/09/23 0556  15 Minute Checks  Location Bedroom  Visual Appearance Calm  Behavior Sleeping  Sleep (Behavioral Health Patients Only)  Calculate sleep? (Click Yes once per 24 hr at 0600 safety check) Yes  Documented sleep last 24 hours 11.5

## 2023-06-09 NOTE — Group Note (Signed)
Recreation Therapy Group Note   Group Topic:Healthy Decision Making  Group Date: 06/09/2023 Start Time: 1010 End Time: 1045 Facilitators: Yazid Pop-McCall, LRT,CTRS Location: [address] Dayroom   Group Topic: Decision Making, Problem Solving, Communication  Goal Area(s) Addresses:  Patient will effectively work with peer towards shared goal.  Patient will identify factors that guided their decision making.  Patient will pro-socially communicate ideas during group session.   Intervention: Survival Scenario - pencil, paper  Group Description: Patients were given a scenario that they were going to be stranded on a deserted [place] for several months before being rescued. Writer tasked them with making a list of 15 things they would choose to bring with them for "survival". The list of items was prioritized most important to least. Each patient would come up with their own list, then work together to create a new list of 15 items while in a group of 3-5 peers. LRT discussed each person's list and how it differed from others. The debrief included discussion of priorities, good decisions versus bad decisions, and how it is important to think before acting so we can make the best decision possible. LRT tied the concept of effective communication among group members to patient's support systems outside of the hospital and its benefit post discharge.  Education: Pharmacist, community, Priorities, Support System, Discharge Planning   Education Outcome: Acknowledges education/In group clarification/Needs additional education   Affect/Mood: N/A   Participation Level: Did not attend    Clinical Observations/Individualized Feedback:    Plan: Continue to engage patient in RT group sessions 2-3x/week.   Emma Schupp-McCall, LRT,CTRS 06/09/2023 1:18 PM

## 2023-06-09 NOTE — Progress Notes (Signed)
[facility] LCSW Progress Note   06/09/2023    3:11 PM  Angel [name]      Type of Note: Attempt on Assessment    CSW tried to complete patient assessment and patient was in room sleeping. CSW will attempt tomorrow.     Signed:   Jacob Moores, MSW, [facility] 06/09/2023 3:11 PM
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
## 2023-06-09 NOTE — BH IP Treatment Plan (Signed)
Interdisciplinary Treatment and Diagnostic Plan Update  06/09/2023 Time of Session: 11:00am Ezell Ayer MRN: [id number]  Principal Diagnosis: Major depressive disorder, single episode, severe with psychotic features (HCC)  Secondary Diagnoses: Principal Problem:   Major depressive disorder, single episode, severe with psychotic features (HCC) Active Problems:   MDD (major depressive disorder)   Current Medications:  Current Facility-Administered Medications  Medication Dose Route Frequency Provider Last Rate Last Admin   acetaminophen (TYLENOL) tablet 650 mg  650 mg Oral Q6H PRN Massengill, Harrold Donath, MD       alum & mag hydroxide-simeth (MAALOX/MYLANTA) 200-200-20 MG/5ML suspension 30 mL  30 mL Oral Q4H PRN Massengill, Harrold Donath, MD       diphenhydrAMINE (BENADRYL) capsule 50 mg  50 mg Oral TID PRN Massengill, Harrold Donath, MD       Or   diphenhydrAMINE (BENADRYL) injection 50 mg  50 mg Intramuscular TID PRN Phineas Inches, MD   50 mg at 06/09/23 2952   haloperidol (HALDOL) tablet 5 mg  5 mg Oral TID PRN Phineas Inches, MD       Or   haloperidol lactate (HALDOL) injection 5 mg  5 mg Intramuscular TID PRN Massengill, Harrold Donath, MD   5 mg at 06/09/23 8413   hydrOXYzine (ATARAX) tablet 25 mg  25 mg Oral Q4H PRN Armandina Stammer I, NP       LORazepam (ATIVAN) tablet 2 mg  2 mg Oral TID PRN Phineas Inches, MD       Or   LORazepam (ATIVAN) injection 2 mg  2 mg Intramuscular TID PRN Massengill, Harrold Donath, MD   2 mg at 06/09/23 2440   magnesium hydroxide (MILK OF MAGNESIA) suspension 30 mL  30 mL Oral Daily PRN Massengill, Harrold Donath, MD       OLANZapine (ZYPREXA) tablet 5 mg  5 mg Oral QHS Nwoko, Agnes I, NP       PTA Medications: No medications prior to admission.    Patient Stressors: Other: Patient currently feeling anxious and overwhelmed with caring for her daughter and herself    Patient Strengths: Ability for insight  Average or above average intelligence  Supportive family/friends    Treatment Modalities: Medication Management, Group therapy, Case management,  1 to 1 session with clinician, Psychoeducation, Recreational therapy.   Physician Treatment Plan for Primary Diagnosis: Major depressive disorder, single episode, severe with psychotic features (HCC) Long Term Goal(s): Improvement in symptoms so as ready for discharge   Short Term Goals: Ability to identify and develop effective coping behaviors will improve Ability to maintain clinical measurements within normal limits will improve Compliance with prescribed medications will improve Ability to identify triggers associated with substance abuse/mental health issues will improve Ability to identify changes in lifestyle to reduce recurrence of condition will improve Ability to verbalize feelings will improve Ability to disclose and discuss suicidal ideas Ability to demonstrate self-control will improve  Medication Management: Evaluate patient's response, side effects, and tolerance of medication regimen.  Therapeutic Interventions: 1 to 1 sessions, Unit Group sessions and Medication administration.  Evaluation of Outcomes: Not Progressing  Physician Treatment Plan for Secondary Diagnosis: Principal Problem:   Major depressive disorder, single episode, severe with psychotic features (HCC) Active Problems:   MDD (major depressive disorder)  Long Term Goal(s): Improvement in symptoms so as ready for discharge   Short Term Goals: Ability to identify and develop effective coping behaviors will improve Ability to maintain clinical measurements within normal limits will improve Compliance with prescribed medications will improve Ability to identify triggers associated with  substance abuse/mental health issues will improve Ability to identify changes in lifestyle to reduce recurrence of condition will improve Ability to verbalize feelings will improve Ability to disclose and discuss suicidal ideas Ability to  demonstrate self-control will improve     Medication Management: Evaluate patient's response, side effects, and tolerance of medication regimen.  Therapeutic Interventions: 1 to 1 sessions, Unit Group sessions and Medication administration.  Evaluation of Outcomes: Not Progressing   RN Treatment Plan for Primary Diagnosis: Major depressive disorder, single episode, severe with psychotic features (HCC) Long Term Goal(s): Knowledge of disease and therapeutic regimen to maintain health will improve  Short Term Goals: Ability to remain free from injury will improve, Ability to verbalize frustration and anger appropriately will improve, Ability to participate in decision making will improve, Ability to verbalize feelings will improve, Ability to identify and develop effective coping behaviors will improve, and Compliance with prescribed medications will improve  Medication Management: RN will administer medications as ordered by provider, will assess and evaluate patient's response and provide education to patient for prescribed medication. RN will report any adverse and/or side effects to prescribing provider.  Therapeutic Interventions: 1 on 1 counseling sessions, Psychoeducation, Medication administration, Evaluate responses to treatment, Monitor vital signs and CBGs as ordered, Perform/monitor CIWA, COWS, AIMS and Fall Risk screenings as ordered, Perform wound care treatments as ordered.  Evaluation of Outcomes: Not Progressing   LCSW Treatment Plan for Primary Diagnosis: Major depressive disorder, single episode, severe with psychotic features (HCC) Long Term Goal(s): Safe transition to appropriate next level of care at discharge, Engage patient in therapeutic group addressing interpersonal concerns.  Short Term Goals: Engage patient in aftercare planning with referrals and resources, Increase social support, Increase emotional regulation, Facilitate acceptance of mental health diagnosis and  concerns, Identify triggers associated with mental health/substance abuse issues, and Increase skills for wellness and recovery  Therapeutic Interventions: Assess for all discharge needs, 1 to 1 time with Social worker, Explore available resources and support systems, Assess for adequacy in community support network, Educate family and significant other(s) on suicide prevention, Complete Psychosocial Assessment, Interpersonal group therapy.  Evaluation of Outcomes: Not Progressing   Progress in Treatment: Attending groups: No. Participating in groups: No. Taking medication as prescribed: Yes. Toleration medication: Yes. Family/Significant other contact made: No, will contact:  Consent Pending Patient understands diagnosis: No. Discussing patient identified problems/goals with staff: No. Medical problems stabilized or resolved: No. Denies suicidal/homicidal ideation: Yes. Issues/concerns pertinent self-inventory: No.  New problem(s) identified: No, Describe:  none reported  New Short Term/Long Term Goal(s):medication stabilization, elimination of SI thoughts, development of comprehensive mental wellness plan.    Patient Goals:  Pt was not appropriate for Tx team meeting.   Discharge Plan or Barriers: Patient recently admitted. CSW will continue to follow and assess for appropriate referrals and possible discharge planning.    Reason for Continuation of Hospitalization: Anxiety Depression Medication stabilization  Estimated Length of Stay:5-7 days  Last 3 [place] Suicide Severity Risk Score: Flowsheet Row [facility] (Current[facility] Most recent reading at 06/07/2023 11:00 PM ED from 06/07/2023 in [facility] at [facility] Most recent reading at 06/07/2023  6:47 AM ED from 01/23/2023 in [facility] at [place] Most recent reading at 01/23/2023  5:21 PM  C-SSRS RISK CATEGORY No Risk No  Risk No Risk       Last PHQ 2/9 Scores:    02/20/2023   11:52 AM 03/05/2022  9:46 AM 09/20/2021    1:30 PM  Depression screen PHQ 2/9  Decreased Interest 0  2  Down, Depressed, Hopeless 1 0 1  PHQ - 2 Score 1 0 3  Altered sleeping 1 0 2  Tired, decreased energy 1 2 2   Change in appetite 1 1 3   Feeling bad or failure about yourself  1 3 2   Trouble concentrating 0 1 0  Moving slowly or fidgety/restless 1 1 1   Suicidal thoughts 0 0 0  PHQ-9 Score 6 8 13     Scribe for Treatment Team: Izell [place], [place] 06/09/2023 12:31 PM

## 2023-06-09 NOTE — Progress Notes (Addendum)
Per chart review and staff report patient refused night dose of Zyprexa last night and had to be given agitation protocol Haldol Ativan and Benadryl yesterday at 3 PM and again this morning at 8:30 AM secondary to what was described by staff to be an incident of being hyper paranoid tangential and delusional, with disorganized and agitated verbally and physically not redirectable verbally.  I attempted to evaluate patient primarily at 10:45 AM but she was asleep as a result of medication given earlier at 8 AM and agitation protocol.  I came back to see her around 1:30 PM, she is lying down in bed but alert, oriented to herself, place as behavioral health unit, oriented to the date month and year.  When asking her regarding reason for admission or how she ended up in the hospital patient presents very tangential and disorganized with nonsensical speech, she denies any history of mental illness except for remote history of diagnosis of depression and anxiety she is unable to answer questions in a linear manner and presents as very unreliable historian unable to tell me how long she has been in [place] but unable to provide the name of a friend/neighbor and she has her number written and agrees for staff to contact her for collateral information.  She presents with racing thoughts and flight of ideas, disorganized and paranoid, pushed speech.  Patient presents with significantly poor insight and judgment regarding her current presentation and her mental illness and the need for psychotropic medications.  I did request second opinion evaluation for forced medications from Dr. Joseph Art, will follow after his assessment.  If second the physician is in agreement with forced medications will start patient on Zyprexa 5 mg p.o. or IM twice daily to address psychosis and mood stabilization, to follow. EKG 9/21 QTc 481, will monitor QTc

## 2023-06-09 NOTE — Plan of Care (Signed)
Problem: Education: Goal: Knowledge of [place] General Education information/materials will improve Outcome: Not Progressing Goal: Emotional status will improve Outcome: Not Progressing Goal: Mental status will improve Outcome: Not Progressing Goal: Verbalization of understanding the information provided will improve Outcome: Not Progressing   Problem: Activity: Goal: Interest or engagement in activities will improve Outcome: Not Progressing Goal: Sleeping patterns will improve Outcome: Not Progressing   Problem: Coping: Goal: Ability to verbalize frustrations and anger appropriately will improve Outcome: Not Progressing Goal: Ability to demonstrate self-control will improve Outcome: Not Progressing   Problem: Health Behavior/Discharge Planning: Goal: Identification of resources available to assist in meeting health care needs will improve Outcome: Not Progressing Goal: Compliance with treatment plan for underlying cause of condition will improve Outcome: Not Progressing   Problem: Physical Regulation: Goal: Ability to maintain clinical measurements within normal limits will improve Outcome: Not Progressing   Problem: Safety: Goal: Periods of time without injury will increase Outcome: Not Progressing   Problem: Education: Goal: Ability to state activities that reduce stress will improve Outcome: Not Progressing   Problem: Coping: Goal: Ability to identify and develop effective coping behavior will improve Outcome: Not Progressing   Problem: Self-Concept: Goal: Ability to identify factors that promote anxiety will improve Outcome: Not Progressing Goal: Level of anxiety will decrease Outcome: Not Progressing Goal: Ability to modify response to factors that promote anxiety will improve Outcome: Not Progressing   Problem: Education: Goal: Utilization of techniques to improve thought processes will improve Outcome: Not Progressing Goal: Knowledge of the  prescribed therapeutic regimen will improve Outcome: Not Progressing   Problem: Activity: Goal: Interest or engagement in leisure activities will improve Outcome: Not Progressing Goal: Imbalance in normal sleep/wake cycle will improve Outcome: Not Progressing   Problem: Coping: Goal: Coping ability will improve Outcome: Not Progressing Goal: Will verbalize feelings Outcome: Not Progressing   Problem: Health Behavior/Discharge Planning: Goal: Ability to make decisions will improve Outcome: Not Progressing Goal: Compliance with therapeutic regimen will improve Outcome: Not Progressing   Problem: Role Relationship: Goal: Will demonstrate positive changes in social behaviors and relationships Outcome: Not Progressing   Problem: Safety: Goal: Ability to disclose and discuss suicidal ideas will improve Outcome: Not Progressing Goal: Ability to identify and utilize support systems that promote safety will improve Outcome: Not Progressing   Problem: Self-Concept: Goal: Will verbalize positive feelings about self Outcome: Not Progressing Goal: Level of anxiety will decrease Outcome: Not Progressing   Problem: Education: Goal: Knowledge of General Education information will improve Description: Including pain rating scale, medication(s)/side effects and non-pharmacologic comfort measures Outcome: Not Progressing   Problem: Health Behavior/Discharge Planning: Goal: Ability to manage health-related needs will improve Outcome: Not Progressing   Problem: Clinical Measurements: Goal: Ability to maintain clinical measurements within normal limits will improve Outcome: Not Progressing Goal: Will remain free from infection Outcome: Not Progressing Goal: Diagnostic test results will improve Outcome: Not Progressing Goal: Respiratory complications will improve Outcome: Not Progressing Goal: Cardiovascular complication will be avoided Outcome: Not Progressing   Problem:  Activity: Goal: Risk for activity intolerance will decrease Outcome: Not Progressing   Problem: Nutrition: Goal: Adequate nutrition will be maintained Outcome: Not Progressing   Problem: Coping: Goal: Level of anxiety will decrease Outcome: Not Progressing   Problem: Elimination: Goal: Will not experience complications related to bowel motility Outcome: Not Progressing Goal: Will not experience complications related to urinary retention Outcome: Not Progressing   Problem: Pain Managment: Goal: General experience of comfort will improve Outcome: Not Progressing  Problem: Safety: Goal: Ability to remain free from injury will improve Outcome: Not Progressing   Problem: Skin Integrity: Goal: Risk for impaired skin integrity will decrease Outcome: Not Progressing
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
## 2023-06-09 NOTE — Group Note (Signed)
LCSW Group Therapy Note   Group Date: 06/09/2023 Start Time: 1300 End Time: 1400   Type of Therapy and Topic:  Group Therapy: Challenging Core Beliefs  Participation Level:  Did Not Attend  Description of Group:  Patients were educated about core beliefs and asked to identify one harmful core belief that they have. Patients were asked to explore from where those beliefs originate. Patients were asked to discuss how those beliefs make them feel and the resulting behaviors of those beliefs. They were then be asked if those beliefs are true and, if so, what evidence they have to support them. Lastly, group members were challenged to replace those negative core beliefs with helpful beliefs.   Therapeutic Goals:   1. Patient will identify harmful core beliefs and explore the origins of such beliefs. 2. Patient will identify feelings and behaviors that result from those core beliefs. 3. Patient will discuss whether such beliefs are true. 4.  Patient will replace harmful core beliefs with helpful ones.  Summary of Patient Progress:  Did Not Attend   Therapeutic Modalities: Cognitive Behavioral Therapy; Solution-Focused Therapy   Angel [name] 06/09/2023  3:42 PM

## 2023-06-09 NOTE — Progress Notes (Signed)
Recreation Therapy Notes  INPATIENT RECREATION THERAPY ASSESSMENT  Patient Details Name: Angel [name] MRN: [id number] DOB: 1991/05/18 Today's Date: 06/09/2023       Information Obtained From: Patient  Able to Participate in Assessment/Interview: Yes (Pt was soft spoken and fading in/out vocally during assessment.)  Patient Presentation: Responsive  Reason for Admission (Per Patient): Other (Comments) (per chart: anxiety, depression)  Patient Stressors:  (None identified)  Coping Skills:   Music, Meditate, Prayer  Leisure Interests (2+):  Individual - Other (Comment) ("everything" Pt then started talking about her children.)  Frequency of Recreation/Participation:  (UTA)  Awareness of Community Resources:  Yes  Community Resources:  [place], Engineering geologist  Current Use:  (UTA)  [place] of Residence:  [place]  Patient Main Form of Transportation: Car  Patient Strengths:  Unable to understand  Patient Identified Areas of Improvement:  Pt started rubbing her wrists and ankles stating they were hurting.  Patient Goal for Hospitalization:  "make child better given the chance to raise her"  Current SI (including self-harm):  No  Current HI:  No  Current AVH: No  Staff Intervention Plan: Group Attendance, Collaborate with Interdisciplinary Treatment Team  Consent to Intern Participation: N/A   Magdelena Kinsella-McCall, LRT,CTRS Griffith Santilli A Frutoso Dimare-McCall 06/09/2023, 2:45 PM

## 2023-06-10 DIAGNOSIS — F323 Major depressive disorder, single episode, severe with psychotic features: Secondary | ICD-10-CM | POA: Diagnosis not present

## 2023-06-10 LAB — CBC WITH DIFFERENTIAL/PLATELET
Abs Immature Granulocytes: 0.01 10*3/uL (ref 0.00–0.07)
Basophils Absolute: 0 10*3/uL (ref 0.0–0.1)
Basophils Relative: 1 %
Eosinophils Absolute: 0.1 10*3/uL (ref 0.0–0.5)
Eosinophils Relative: 1 %
HCT: 39 % (ref 36.0–46.0)
Hemoglobin: 12.4 g/dL (ref 12.0–15.0)
Immature Granulocytes: 0 %
Lymphocytes Relative: 39 %
Lymphs Abs: 2.2 10*3/uL (ref 0.7–4.0)
MCH: 30.1 pg (ref 26.0–34.0)
MCHC: 31.8 g/dL (ref 30.0–36.0)
MCV: 94.7 fL (ref 80.0–100.0)
Monocytes Absolute: 0.4 10*3/uL (ref 0.1–1.0)
Monocytes Relative: 7 %
Neutro Abs: 2.8 10*3/uL (ref 1.7–7.7)
Neutrophils Relative %: 52 %
Platelets: 263 10*3/uL (ref 150–400)
RBC: 4.12 MIL/uL (ref 3.87–5.11)
RDW: 11.9 % (ref 11.5–15.5)
WBC: 5.5 10*3/uL (ref 4.0–10.5)
nRBC: 0 % (ref 0.0–0.2)

## 2023-06-10 LAB — URINALYSIS, ROUTINE W REFLEX MICROSCOPIC
Bilirubin Urine: NEGATIVE
Glucose, UA: NEGATIVE mg/dL
Hgb urine dipstick: NEGATIVE
Ketones, ur: 20 mg/dL — AB
Nitrite: NEGATIVE
Protein, ur: NEGATIVE mg/dL
Specific Gravity, Urine: 1.023 (ref 1.005–1.030)
pH: 7 (ref 5.0–8.0)

## 2023-06-10 LAB — VITAMIN D 25 HYDROXY (VIT D DEFICIENCY, FRACTURES): Vit D, 25-Hydroxy: 35.33 ng/mL (ref 30–100)

## 2023-06-10 LAB — RAPID URINE DRUG SCREEN, HOSP PERFORMED
Amphetamines: NOT DETECTED
Barbiturates: NOT DETECTED
Benzodiazepines: POSITIVE — AB
Cocaine: NOT DETECTED
Opiates: NOT DETECTED
Tetrahydrocannabinol: NOT DETECTED

## 2023-06-10 MED ORDER — OLANZAPINE 10 MG PO TBDP
10.0000 mg | ORAL_TABLET | Freq: Every day | ORAL | Status: DC
  Administered 2023-06-10: 10 mg via ORAL
  Filled 2023-06-10 (×3): qty 1

## 2023-06-10 MED ORDER — PROPRANOLOL HCL 10 MG PO TABS
10.0000 mg | ORAL_TABLET | Freq: Two times a day (BID) | ORAL | Status: DC
  Administered 2023-06-10 – 2023-06-11 (×4): 10 mg via ORAL
  Filled 2023-06-10 (×8): qty 1

## 2023-06-10 MED ORDER — OLANZAPINE 5 MG PO TBDP: 5.0000 mg | ORAL_TABLET | Freq: Three times a day (TID) | ORAL | Status: DC | PRN

## 2023-06-10 MED ORDER — OLANZAPINE 5 MG PO TBDP
5.0000 mg | ORAL_TABLET | Freq: Every day | ORAL | Status: DC
  Administered 2023-06-11: 5 mg via ORAL
  Filled 2023-06-10 (×3): qty 1

## 2023-06-10 MED ORDER — OLANZAPINE 10 MG IM SOLR
5.0000 mg | Freq: Every day | INTRAMUSCULAR | Status: DC
  Filled 2023-06-10 (×3): qty 10

## 2023-06-10 MED ORDER — OLANZAPINE 10 MG IM SOLR: 5.0000 mg | Freq: Three times a day (TID) | INTRAMUSCULAR | Status: DC | PRN

## 2023-06-10 NOTE — Progress Notes (Signed)
   06/10/23 2145  Psych Admission Type (Psych Patients Only)  Admission Status Voluntary  Psychosocial Assessment  Patient Complaints Worrying;Anxiety  Eye Contact Suspiciousness  Facial Expression Worried  Affect Anxious;Angry  Speech Argumentative;Pressured  Interaction Defensive;Demanding;Intrusive  Motor Activity Restless;Fidgety  Appearance/Hygiene Improved  Aggressive Behavior  Effect No apparent injury  Thought Process  Coherency Circumstantial;Disorganized;Tangential  Content Blaming others;Preoccupation  Delusions None reported or observed  Perception Derealization  Hallucination None reported or observed  Judgment Poor  Confusion Mild  Danger to Self  Current suicidal ideation? Denies  Danger to Others  Danger to Others None reported or observed
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
## 2023-06-10 NOTE — Group Note (Signed)
Recreation Therapy Group Note   Group Topic:Health and Wellness  Group Date: 06/10/2023 Start Time: 1040 End Time: 1110 Facilitators: Kamika Goodloe-McCall, LRT,CTRS Location: [address] Dayroom   Group Topic: Wellness  Goal Area(s) Addresses:  Patient will define components of whole wellness. Patient will verbalize benefit of whole wellness.  Group Description: Exercise. LRT explained to patients the importance of being physically active. LRT stressed that being active doesn't mean do anything strenuous but simply getting your body moving. Patients took turns leading the group in the exercises of their choosing. Patients were to try and exercise for at least 30 minutes. Patients were instructed to take breaks and get water as needed.   Education: Wellness, Building control surveyor.   Education Outcome: Acknowledges education/In group clarification offered/Needs additional education.    Affect/Mood: Appropriate   Participation Level: Minimal   Participation Quality: Independent   Behavior: Appropriate   Speech/Thought Process: Relevant   Insight: Fair   Judgement: Fair    Modes of Intervention: Music   Patient Response to Interventions:  Engaged   Education Outcome:  In group clarification offered    Clinical Observations/Individualized Feedback: Pt spent the first few moments dancing and engaged. Pt eventually left group early and did not return.     Plan: Continue to engage patient in RT group sessions 2-3x/week.   Sherrye Puga-McCall, LRT,CTRS 06/10/2023 12:18 PM

## 2023-06-10 NOTE — Progress Notes (Signed)
   06/10/23 1200  Psych Admission Type (Psych Patients Only)  Admission Status Voluntary  Psychosocial Assessment  Patient Complaints Anxiety;Worrying  Eye Contact Fair  Facial Expression Anxious;Worried  Affect Anxious  Sports coach Activity Other (Comment) (wnl)  Appearance/Hygiene Unremarkable;In scrubs  Behavior Characteristics Resistant to care  Mood Anxious;Despair  Thought Process  Coherency Disorganized  Content Blaming others  Delusions None reported or observed  Perception WDL  Hallucination None reported or observed  Judgment Impaired  Danger to Self  Current suicidal ideation? Denies  Danger to Others  Danger to Others None reported or observed   This morning pt was anxious, focused on welfare of her child. Patient resistant to medication but did accept with encouragement. Pt spoke with her parents on the phone, after phone conversation pt notably much calmer and smiling. Pt denies SI/HI/AVH.

## 2023-06-10 NOTE — [facility] Group Notes (Signed)
Adult Psychoeducational Group Note  Date:  06/10/2023 Time:  4:26 PM  Group Topic/Focus:  Goals Group:   The focus of this group is to help patients establish daily goals to achieve during treatment and discuss how the patient can incorporate goal setting into their daily lives to aide in recovery. Orientation:   The focus of this group is to educate the patient on the purpose and policies of crisis stabilization and provide a format to answer questions about their admission.  The group details unit policies and expectations of patients while admitted.  Participation Level:  Active  Participation Quality:  Appropriate and Attentive  Affect:  Appropriate  Cognitive:  Appropriate  Insight: Appropriate  Engagement in Group:  Engaged  Modes of Intervention:  Discussion  Additional Comments:  Pt attended the goals group and remained appropriate and engaged throughout the duration of the group.   Sheran Lawless 06/10/2023, 4:26 PM

## 2023-06-10 NOTE — Plan of Care (Signed)
  Problem: Activity: Goal: Sleeping patterns will improve Outcome: Progressing   Problem: Safety: Goal: Periods of time without injury will increase Outcome: Progressing   Problem: Education: Goal: Emotional status will improve 06/10/2023 2203 by Delos Haring, RN Outcome: Not Progressing 06/10/2023 2159 by Delos Haring, RN Outcome: Progressing Goal: Mental status will improve 06/10/2023 2203 by Delos Haring, RN Outcome: Not Progressing 06/10/2023 2159 by Delos Haring, RN Outcome: Progressing   Problem: Activity: Goal: Interest or engagement in activities will improve Outcome: Not Progressing   Problem: Coping: Goal: Ability to verbalize frustrations and anger appropriately will improve Outcome: Not Progressing Goal: Ability to demonstrate self-control will improve Outcome: Not Progressing

## 2023-06-10 NOTE — Group Note (Signed)
Date:  06/10/2023 Time:  9:35 PM  Group Topic/Focus:  Wrap-Up Group:   The focus of this group is to help patients review their daily goal of treatment and discuss progress on daily workbooks.    Participation Level:  Active  Participation Quality:  Appropriate and Sharing  Affect:  Appropriate  Cognitive:  Appropriate  Insight: Appropriate  Engagement in Group:  Engaged  Modes of Intervention:  Activity and Socialization  Additional Comments:  The patient stated that she had a good day today. The patient stated that she enjoyed one of the groups session and stated that it was very "inspirational". The patient stated that she wants to gain a better understanding and get better mentally, physically and emotionally. The patient rated her day a 10/10 and stated she would rate it more if she could. The patient did participate in the ice breaker activity at the end of the group.   Kennieth Francois 06/10/2023, 9:35 PM
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
## 2023-06-10 NOTE — Plan of Care (Signed)
  Problem: Education: Goal: Knowledge of [facility] Education information/materials will improve Outcome: Progressing Goal: Emotional status will improve Outcome: Progressing Goal: Mental status will improve Outcome: Progressing Goal: Verbalization of understanding the information provided will improve Outcome: Progressing   Problem: Activity: Goal: Interest or engagement in activities will improve Outcome: Progressing Goal: Sleeping patterns will improve Outcome: Progressing   Problem: Coping: Goal: Ability to verbalize frustrations and anger appropriately will improve Outcome: Progressing Goal: Ability to demonstrate self-control will improve Outcome: Progressing   Problem: Health Behavior/Discharge Planning: Goal: Identification of resources available to assist in meeting health care needs will improve Outcome: Progressing Goal: Compliance with treatment plan for underlying cause of condition will improve Outcome: Progressing   Problem: Physical Regulation: Goal: Ability to maintain clinical measurements within normal limits will improve Outcome: Progressing   Problem: Safety: Goal: Periods of time without injury will increase Outcome: Progressing   Problem: Education: Goal: Ability to state activities that reduce stress will improve Outcome: Progressing   Problem: Coping: Goal: Ability to identify and develop effective coping behavior will improve Outcome: Progressing   Problem: Self-Concept: Goal: Ability to identify factors that promote anxiety will improve Outcome: Progressing Goal: Level of anxiety will decrease Outcome: Progressing Goal: Ability to modify response to factors that promote anxiety will improve Outcome: Progressing   Problem: Education: Goal: Utilization of techniques to improve thought processes will improve Outcome: Progressing Goal: Knowledge of the prescribed therapeutic regimen will improve Outcome: Progressing   Problem:  Activity: Goal: Interest or engagement in leisure activities will improve Outcome: Progressing Goal: Imbalance in normal sleep/wake cycle will improve Outcome: Progressing   Problem: Coping: Goal: Coping ability will improve Outcome: Progressing Goal: Will verbalize feelings Outcome: Progressing   Problem: Health Behavior/Discharge Planning: Goal: Ability to make decisions will improve Outcome: Progressing Goal: Compliance with therapeutic regimen will improve Outcome: Progressing   Problem: Role Relationship: Goal: Will demonstrate positive changes in social behaviors and relationships Outcome: Progressing   Problem: Safety: Goal: Ability to disclose and discuss suicidal ideas will improve Outcome: Progressing Goal: Ability to identify and utilize support systems that promote safety will improve Outcome: Progressing   Problem: Self-Concept: Goal: Will verbalize positive feelings about self Outcome: Progressing Goal: Level of anxiety will decrease Outcome: Progressing   Problem: Education: Goal: Knowledge of General Education information will improve Description: Including pain rating scale, medication(s)/side effects and non-pharmacologic comfort measures Outcome: Progressing   Problem: Health Behavior/Discharge Planning: Goal: Ability to manage health-related needs will improve Outcome: Progressing   Problem: Clinical Measurements: Goal: Ability to maintain clinical measurements within normal limits will improve Outcome: Progressing Goal: Will remain free from infection Outcome: Progressing Goal: Diagnostic test results will improve Outcome: Progressing Goal: Respiratory complications will improve Outcome: Progressing Goal: Cardiovascular complication will be avoided Outcome: Progressing   Problem: Activity: Goal: Risk for activity intolerance will decrease Outcome: Progressing   Problem: Nutrition: Goal: Adequate nutrition will be maintained Outcome:  Progressing   Problem: Coping: Goal: Level of anxiety will decrease Outcome: Progressing   Problem: Elimination: Goal: Will not experience complications related to bowel motility Outcome: Progressing Goal: Will not experience complications related to urinary retention Outcome: Progressing   Problem: Pain Managment: Goal: General experience of comfort will improve Outcome: Progressing   Problem: Safety: Goal: Ability to remain free from injury will improve Outcome: Progressing   Problem: Skin Integrity: Goal: Risk for impaired skin integrity will decrease Outcome: Progressing

## 2023-06-11 DIAGNOSIS — F323 Major depressive disorder, single episode, severe with psychotic features: Secondary | ICD-10-CM | POA: Diagnosis not present

## 2023-06-11 MED ORDER — LORAZEPAM 0.5 MG PO TABS
0.5000 mg | ORAL_TABLET | Freq: Three times a day (TID) | ORAL | Status: DC
  Administered 2023-06-11 – 2023-06-13 (×6): 0.5 mg via ORAL
  Filled 2023-06-11 (×6): qty 1

## 2023-06-11 MED ORDER — MENTHOL 3 MG MT LOZG: 1.0000 | LOZENGE | OROMUCOSAL | Status: DC | PRN

## 2023-06-11 MED ORDER — OLANZAPINE 10 MG IM SOLR
5.0000 mg | Freq: Every day | INTRAMUSCULAR | Status: DC
  Filled 2023-06-11 (×3): qty 10

## 2023-06-11 MED ORDER — OLANZAPINE 10 MG PO TBDP
10.0000 mg | ORAL_TABLET | Freq: Every day | ORAL | Status: DC
  Administered 2023-06-12 – 2023-06-20 (×9): 10 mg via ORAL
  Filled 2023-06-11 (×12): qty 1

## 2023-06-11 MED ORDER — OLANZAPINE 15 MG PO TBDP
15.0000 mg | ORAL_TABLET | Freq: Every day | ORAL | Status: DC
  Administered 2023-06-11 – 2023-06-12 (×2): 15 mg via ORAL
  Filled 2023-06-11 (×3): qty 1

## 2023-06-11 MED ORDER — OLANZAPINE 10 MG IM SOLR
5.0000 mg | Freq: Every day | INTRAMUSCULAR | Status: DC
  Filled 2023-06-11 (×7): qty 10

## 2023-06-11 NOTE — [facility] Group Notes (Signed)
Adult Psychoeducational Group Note  Date:  06/11/2023 Time:  9:41 AM  Group Topic/Focus:  Goals Group:   The focus of this group is to help patients establish daily goals to achieve during treatment and discuss how the patient can incorporate goal setting into their daily lives to aide in recovery. Orientation:   The focus of this group is to educate the patient on the purpose and policies of crisis stabilization and provide a format to answer questions about their admission.  The group details unit policies and expectations of patients while admitted.  Participation Level:  Did Not Attend  Participation Quality:    Affect:    Cognitive:    Insight:   Engagement in Group:    Modes of Intervention:    Additional Comments:    Sheran Lawless 06/11/2023, 9:41 AM

## 2023-06-11 NOTE — Progress Notes (Signed)
[facility] MD Progress Note  06/11/2023 6:42 PM Angel [name]  MRN:  [id number]  Principal Problem: Major depressive disorder, single episode, severe with psychotic features (HCC) Diagnosis: Principal Problem:   Major depressive disorder, single episode, severe with psychotic features (HCC) Active Problems:   MDD (major depressive disorder)   Insomnia  Reason for admission: Angel [name] is a 32 yo [place] female with prior mental health diagnoses of MDD & GAD who initially presented to the [facility] ER on 9/21 with complaints of chest pain. Pt presented to the ER with her toddler & DSS was contacted & child was later picked up by a friend of estranged husband due to 82 B order in place. Psychiatry was consulted, and pt was deemed to lack good decision making capacity sufficient enough to take care of herself or of her child.  She was recommended for inpatient behavioral health admission for treatment and stabilization of her mental status.  Patient was transferred to this [facility] on 9/22.  Patient assessment note:  Patient remains disorganized thoughts which are also illogical; she is hyperverbal, tangential, difficult to redirect, presents with flight of ideas, irritable when attempts are being made to redirect her, talks about multiple topics which are illogical, perseverates about topics unrelated to questions being asked.  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  She talks about going into the emergency room, and states that her back was not checked, or scanned, but that she was sent to the mental health unit, she tells writer to go check her medical history, before coming back to speak to her.  She Nurse, children's of separating her from her child.   Patient presents with  a dysphoric and an irritable mood as she goes on a tangent about multiple topics, she talks about everything being "recorded", and when educated on the need to drink water so as to stay hydrated, patient states "do you want me to drink an entire ocean?"  She perseverates about wanting her "childhood food", and writer tells her that an order will be placed if someone can bring her food, but she states that she does not have any one who can bring her food.  She however denies SI/HI/AVH.  She is suspicious, and presents with paranoia.  She remains with poor insight regarding her mental illness and her need for treatment, judgment is poor at this time, but it is our consensus that patient remains in need of continuous hospitalization as current mental status renders her at a danger to self outside of the hospital setting.  We increased patient's Zyprexa to 10 mg in the mornings, and 15 mg nightly for management of mood.  Patient is to get 5 mg IM in the mornings if she refuses p.o. Zyprexa, and she is to get 5 mg IM at bedtime if she refuses the p.o.  Forced medication order remains in place.  Other medications also being continued as listed below.  Total Time spent with patient: 45 minutes  Past Psychiatric History: See H & P  Past Medical History:  Past Medical History:  Diagnosis Date   Kidney stones    PCOS (polycystic ovarian syndrome)     Past Surgical History:  Procedure Laterality Date   APPENDECTOMY  2012   in [place]  CESAREAN SECTION N/A 03/13/2022   Procedure: CESAREAN SECTION;  Surgeon: Milas Hock, MD;  Location: [facility] LD ORS;  Service: Obstetrics;  Laterality: N/A;   Kidney stone removal  2021   and 2014   RHINOPLASTY     Family History:  Family History  Problem Relation Age of Onset   Hypertension Mother    Kidney Stones Mother    Hypertension Father    Kidney Stones Father    Thyroid disease Sister    Seizures Sister    Polycystic ovary syndrome Sister    Family  Psychiatric  History: See H & P Social History:  Social History   Substance and Sexual Activity  Alcohol Use No     Social History   Substance and Sexual Activity  Drug Use No    Social History   Socioeconomic History   Marital status: Legally Separated    Spouse name: pt declined   Number of children: 1   Years of education: Not on file   Highest education level: Patient refused  Occupational History   Not on file  Tobacco Use   Smoking status: Never   Smokeless tobacco: Never  Vaping Use   Vaping status: Never Used  Substance and Sexual Activity   Alcohol use: No   Drug use: No   Sexual activity: Not Currently    Birth control/protection: None  Other Topics Concern   Not on file  Social History Narrative   Not on file   Social Determinants of Health   Financial Resource Strain: Not on file  Food Insecurity: Food Insecurity Present (06/07/2023)   Hunger Vital Sign    Worried About Running Out of Food in the Last Year: Sometimes true    Ran Out of Food in the Last Year: Never true  Transportation Needs: No Transportation Needs (06/07/2023)   PRAPARE - Administrator, Civil Service (Medical): No    Lack of Transportation (Non-Medical): No  Physical Activity: Not on file  Stress: Not on file  Social Connections: Not on file   Sleep: Good  Appetite:  Fair  Current Medications: Current Facility-Administered Medications  Medication Dose Route Frequency Provider Last Rate Last Admin   acetaminophen (TYLENOL) tablet 650 mg  650 mg Oral Q6H PRN Massengill, Nathan, MD       alum & mag hydroxide-simeth (MAALOX/MYLANTA) 200-200-20 MG/5ML suspension 30 mL  30 mL Oral Q4H PRN Massengill, Harrold Donath, MD       diphenhydrAMINE (BENADRYL) capsule 50 mg  50 mg Oral TID PRN Massengill, Harrold Donath, MD       Or   diphenhydrAMINE (BENADRYL) injection 50 mg  50 mg Intramuscular TID PRN Phineas Inches, MD   50 mg at 06/09/23 3016   hydrOXYzine (ATARAX) tablet 25 mg  25 mg  Oral Q4H PRN Armandina Stammer I, NP   25 mg at 06/10/23 2059   LORazepam (ATIVAN) tablet 0.5 mg  0.5 mg Oral Q8H Massengill, Nathan, MD   0.5 mg at 06/11/23 1438   LORazepam (ATIVAN) tablet 1 mg  1 mg Oral TID PRN Starleen Blue, NP       magnesium hydroxide (MILK OF MAGNESIA) suspension 30 mL  30 mL Oral Daily PRN Massengill, Nathan, MD       menthol-cetylpyridinium (CEPACOL) lozenge 3 mg  1 lozenge Oral PRN Tadarrius Burch, NP       OLANZapine zydis (ZYPREXA) disintegrating tablet 5 mg  5 mg Oral TID PRN Phineas Inches, MD  Or   OLANZapine (ZYPREXA) injection 5 mg  5 mg Intramuscular TID PRN Phineas Inches, MD       Melene Muller ON 06/12/2023] OLANZapine zydis (ZYPREXA) disintegrating tablet 10 mg  10 mg Oral Daily Massengill, Harrold Donath, MD       Or   Melene Muller ON 06/12/2023] OLANZapine (ZYPREXA) injection 5 mg  5 mg Intramuscular Daily Massengill, Harrold Donath, MD       OLANZapine zydis (ZYPREXA) disintegrating tablet 15 mg  15 mg Oral QHS Massengill, Nathan, MD       Or   OLANZapine (ZYPREXA) injection 5 mg  5 mg Intramuscular QHS Massengill, Harrold Donath, MD       propranolol (INDERAL) tablet 10 mg  10 mg Oral Q12H Massengill, Harrold Donath, MD   10 mg at 06/11/23 0830    Lab Results:  Results for orders placed or performed during the hospital encounter of 06/07/23 (from the past 48 hour(s))  Rapid urine drug screen (hospital performed)     Status: Abnormal   Collection Time: 06/09/23  7:48 PM  Result Value Ref Range   Opiates NONE DETECTED NONE DETECTED   Cocaine NONE DETECTED NONE DETECTED   Benzodiazepines POSITIVE (A) NONE DETECTED   Amphetamines NONE DETECTED NONE DETECTED   Tetrahydrocannabinol NONE DETECTED NONE DETECTED   Barbiturates NONE DETECTED NONE DETECTED    Comment: (NOTE) DRUG SCREEN FOR MEDICAL PURPOSES ONLY.  IF CONFIRMATION IS NEEDED FOR ANY PURPOSE, NOTIFY LAB WITHIN 5 DAYS.  LOWEST DETECTABLE LIMITS FOR URINE DRUG SCREEN Drug Class                     Cutoff (ng/mL) Amphetamine  and metabolites    1000 Barbiturate and metabolites    200 Benzodiazepine                 200 Opiates and metabolites        300 Cocaine and metabolites        300 THC                            50 Performed at [facility], [address] [place], [place] [address]   Urinalysis, Routine w reflex microscopic -Urine, Clean Catch     Status: Abnormal   Collection Time: 06/09/23  7:48 PM  Result Value Ref Range   Color, Urine YELLOW YELLOW   APPearance CLEAR CLEAR   Specific Gravity, Urine 1.023 1.005 - 1.030   pH 7.0 5.0 - 8.0   Glucose, UA NEGATIVE NEGATIVE mg/dL   Hgb urine dipstick NEGATIVE NEGATIVE   Bilirubin Urine NEGATIVE NEGATIVE   Ketones, ur 20 (A) NEGATIVE mg/dL   Protein, ur NEGATIVE NEGATIVE mg/dL   Nitrite NEGATIVE NEGATIVE   Leukocytes,Ua LARGE (A) NEGATIVE   RBC / HPF 0-5 0 - 5 RBC/hpf   WBC, UA 21-50 0 - 5 WBC/hpf   Bacteria, UA RARE (A) NONE SEEN   Squamous Epithelial / HPF 6-10 0 - 5 /HPF   Mucus PRESENT    Ca Oxalate Crys, UA PRESENT     Comment: Performed at [facility], [address] [place], [place] [address]  CBC with Differential/Platelet     Status: None   Collection Time: 06/10/23  6:31 PM  Result Value Ref Range   WBC 5.5 4.0 - 10.5 K/uL   RBC 4.12 3.87 - 5.11 MIL/uL   Hemoglobin 12.4 12.0 - 15.0 g/dL   HCT 39.0  36.0 - 46.0 %   MCV 94.7 80.0 - 100.0 fL   MCH 30.1 26.0 - 34.0 pg   MCHC 31.8 30.0 - 36.0 g/dL   RDW 40.9 81.1 - 91.4 %   Platelets 263 150 - 400 K/uL   nRBC 0.0 0.0 - 0.2 %   Neutrophils Relative % 52 %   Neutro Abs 2.8 1.7 - 7.7 K/uL   Lymphocytes Relative 39 %   Lymphs Abs 2.2 0.7 - 4.0 K/uL   Monocytes Relative 7 %   Monocytes Absolute 0.4 0.1 - 1.0 K/uL   Eosinophils Relative 1 %   Eosinophils Absolute 0.1 0.0 - 0.5 K/uL   Basophils Relative 1 %   Basophils Absolute 0.0 0.0 - 0.1 K/uL   Immature Granulocytes 0 %   Abs Immature Granulocytes 0.01 0.00 - 0.07 K/uL    Comment:  Performed at [facility], [address] [place], [place] [address]  VITAMIN D 25 Hydroxy (Vit-D Deficiency, Fractures)     Status: None   Collection Time: 06/10/23  6:31 PM  Result Value Ref Range   Vit D, 25-Hydroxy 35.33 30 - 100 ng/mL    Comment: (NOTE) Vitamin D deficiency has been defined by the Institute of Medicine  and an Endocrine Society practice guideline as a level of serum 25-OH  vitamin D less than 20 ng/mL (1,2). The Endocrine Society went on to  further define vitamin D insufficiency as a level between 21 and 29  ng/mL (2).  1. IOM (Institute of Medicine). 2010. Dietary reference intakes for  calcium and D. [place]: [employer]. 2. Holick MF, Binkley Vermilion, Bischoff-Ferrari HA, et al. Evaluation,  treatment, and prevention of vitamin D deficiency: an Endocrine  Society clinical practice guideline, JCEM. 2011 Jul; 96(7): 1911-30.  Performed at [facility], [address], [place], [place] [address]     Blood Alcohol level:  No results found for: "ETH"  Metabolic Disorder Labs: Lab Results  Component Value Date   HGBA1C 4.8 06/09/2023   MPG 91.06 06/09/2023   No results found for: "PROLACTIN" Lab Results  Component Value Date   CHOL 147 06/09/2023   TRIG 57 06/09/2023   HDL 47 06/09/2023   CHOLHDL 3.1 06/09/2023   VLDL 11 06/09/2023   LDLCALC 89 06/09/2023   LDLCALC 43 08/14/2021   Physical Findings: AIMS:0 CIWA: n/a COWS:  n/a  Musculoskeletal: Strength & Muscle Tone: within normal limits Gait & Station: normal Patient leans: N/A  Psychiatric Specialty Exam:  Presentation  General Appearance:  Appropriate for Environment; Casual; Fairly Groomed  Eye Contact: Good  Speech: Pressured  Speech Volume: Increased  Handedness: Right   Mood and Affect  Mood: Anxious; Irritable; Dysphoric  Affect: Congruent   Thought Process  Thought Processes: Irrevelant;  Disorganized  Descriptions of Associations:Tangential  Orientation:Partial  Thought Content:Illogical; Perseveration  History of Schizophrenia/Schizoaffective disorder:Yes  Duration of Psychotic Symptoms:Greater than six months  Hallucinations:Hallucinations: None  Ideas of Reference:Percusatory; None  Suicidal Thoughts:Suicidal Thoughts: No  Homicidal Thoughts:Homicidal Thoughts: No   Sensorium  Memory: Immediate Good  Judgment: Poor  Insight: Poor   Executive Functions  Concentration: Poor  Attention Span: Poor  Recall: Poor  Fund of Knowledge: Poor  Language: Poor   Psychomotor Activity  Psychomotor Activity: Psychomotor Activity: Normal   Assets  Assets: Resilience   Sleep  Sleep: Sleep: Fair    Physical Exam: Physical Exam Constitutional:      Appearance: Normal appearance.  Musculoskeletal:  General: Normal range of motion.     Cervical back: Normal range of motion.  Neurological:     General: No focal deficit present.     Mental Status: She is alert and oriented to person, place, and time.    Review of Systems  Constitutional: Negative.   HENT: Negative.    Eyes: Negative.   Respiratory: Negative.    Cardiovascular: Negative.   Gastrointestinal: Negative.   Genitourinary: Negative.   Musculoskeletal: Negative.   Skin: Negative.   Neurological: Negative.   Psychiatric/Behavioral:  Positive for depression and hallucinations. Negative for memory loss, substance abuse and suicidal ideas. The patient is nervous/anxious and has insomnia.    Blood pressure 99/83, pulse 96, temperature (!) 97.4 F (36.3 C), temperature source Oral, resp. rate 16, height 5\' 2"  (1.575 m), weight 41.7 kg, last menstrual period 06/04/2023, SpO2 100%, not currently breastfeeding. Body mass index is 16.83 kg/m.  Treatment Plan Summary: Daily contact with patient to assess and evaluate symptoms and progress in treatment and Medication  management.    Principal/active diagnoses.  Major depressive disorder, single episode, severe with psychotic features.  R/o Bipolar 1 disorder, manic episode.   Plan: The risks/benefits/side-effects/alternatives to the medications in use were discussed in detail with the patient and time was given for patient's questions. The patient consents to medication trial.    Medications: FORCED MED ORDER IN PLACE: -Increase Zyprexa from 5 mg to 10 mg PO OR 5 mg IM in the mornings-  -Increase HS Zyprexa from  10 mg 15 mg PO nightly or 5 mg IM for psychosis and mood stabilization-MUST GIVE IM IF PATIENT REFUSES PO -Start Ativan 0.5 mg 3 times daily for anxiety -Continue hydroxyzine 25 mg po tid prn for anxiety.  Agitation protocols: Cont as recommended;  -Continue Benadryl 50 mg po or IM tid prn. -Continue agitation protocol: tid prn Zyprexa PO or IM 5 mg TID -Discontinue Ativan on the agitation Protocol  Labs reviewed: UA with large leukocytes, rare bacteria, but pt is asymptomatic.  Other PRNS -Continue Tylenol 650 mg every 6 hours PRN for mild pain -Continue Maalox 30 ml Q 4 hrs PRN for indigestion -Continue MOM 30 ml po Q 6 hrs for constipation   Safety and Monitoring: Voluntary admission to inpatient psychiatric unit for safety, stabilization and treatment Daily contact with patient to assess and evaluate symptoms and progress in treatment Patient's case to be discussed in multi-disciplinary team meeting Observation Level : q15 minute checks Vital signs: q12 hours Precautions: Safety   Discharge Planning: Social work and case management to assist with discharge planning and identification of hospital follow-up needs prior to discharge Estimated LOS: 5-7 days Discharge Concerns: Need to establish a safety plan; Medication compliance and effectiveness Discharge Goals: Return home with outpatient referrals for mental health follow-up including medication management/psychotherapy    Observation Level/Precautions:  15 minute checks  Laboratory:   Will obtain a Lipid panel, hgba1c, lipid panel, TSH, U/A, UDS & a repeat cmp  Psychotherapy: Enrolled in the group sessions.   Medications:  See [facility]  Consultations: As needed.   Discharge Concerns: Safety, mood stability.   Estimated LOS: 7 days.  Other: NA.      Physician Treatment Plan for Primary Diagnosis: Major depressive disorder, single episode, severe with psychotic features (HCC) Long Term Goal(s): Improvement in symptoms so as ready for discharge   Short Term Goals: Ability to identify changes in lifestyle to reduce recurrence of condition will improve, Ability to verbalize feelings will  improve, Ability to disclose and discuss suicidal ideas, and Ability to demonstrate self-control will improve   Physician Treatment Plan for Secondary Diagnosis: Principal Problem:   Major depressive disorder, single episode, severe with psychotic features (HCC) Active Problems:   MDD (major depressive disorder)   Long Term Goal(s): Improvement in symptoms so as ready for discharge   Short Term Goals: Ability to identify and develop effective coping behaviors will improve, Ability to maintain clinical measurements within normal limits will improve, Compliance with prescribed medications will improve, and Ability to identify triggers associated with substance abuse/mental health issues will improve   I certify that inpatient services furnished can reasonably be expected to improve the patient's condition.    Starleen Blue, NP 06/11/2023, 6:42 PM Patient ID: Angel [name], female   DOB: [birth year], 32 y.o.   MRN: [id number]

## 2023-06-11 NOTE — Progress Notes (Signed)
   06/11/23 2145  Psych Admission Type (Psych Patients Only)  Admission Status Voluntary  Psychosocial Assessment  Patient Complaints Anxiety;Worrying  Eye Contact Suspiciousness  Facial Expression Worried  Affect Anxious;Angry  Speech Argumentative;Pressured  Interaction Defensive;Demanding;Intrusive  Motor Activity Restless;Fidgety  Appearance/Hygiene Improved  Behavior Characteristics Cooperative;Resistant to care  Mood Anxious;Suspicious;Preoccupied  Aggressive Behavior  Effect No apparent injury  Thought Process  Coherency Circumstantial;Disorganized;Tangential  Content Blaming others;Preoccupation  Delusions None reported or observed  Perception Derealization  Hallucination None reported or observed  Judgment Poor  Confusion Mild  Danger to Self  Current suicidal ideation? Denies  Danger to Others  Danger to Others None reported or observed

## 2023-06-11 NOTE — Plan of Care (Signed)
Problem: Activity: Goal: Interest or engagement in activities will improve Outcome: Progressing   Problem: Physical Regulation: Goal: Ability to maintain clinical measurements within normal limits will improve Outcome: Progressing   Problem: Safety: Goal: Periods of time without injury will increase Outcome: Progressing   Pt's mood remains labile with intermittent irritability, agitation, verbal outbursts throughout this shift. She's defensive, argumentative with tangential, pressured speech when approach for medications "I'm not going to take it, till I talk to my doctor. I want to explain what happened, my side with my husband. I need my baby, she has her pediatrician appointment. I need to go today. I want to see my baby. Only my uncle (mom's brother) has Bipolar in [place]. He takes medicines but I pray for the rest of them; they are alive". Proceeded to being tangential about her life growing up in [place], about disappointed with her ex-husband and his mother "She was against me, she is not a good woman". Continues to demand to see her child "I have to get my baby". Attended and participated in scheduled groups but is disorganized and easily distracted. Tolerates meals, fluids and medications well without discomfort. Safety checks maintained at Q 15 minutes intervals without incident. Off unit for meals, recreation time in courtyard; returned without issues. Continues to require multiple verbals redirections to cooperate with unit routines and comply with current medication regimen.

## 2023-06-11 NOTE — Group Note (Signed)
Date:  06/11/2023 Time:  9:23 PM  Group Topic/Focus:  Wrap-Up Group:   The focus of this group is to help patients review their daily goal of treatment and discuss progress on daily workbooks.    Participation Level:  Active  Participation Quality:  Appropriate and Sharing  Affect:  Appropriate  Cognitive:  Appropriate and Disorganized  Insight: Appropriate  Engagement in Group:  Engaged  Modes of Intervention:  Socialization  Additional Comments:  The patient stated that her overall day was a 85%. The patient stated that she is "managing with the medication and the help/support form other is helping". The patient stated the remaining 15% is because "she doesn't fully know how to feel about the medication" and she stated that she is missing her child. The patient later shared her concerns with the MD and needing a translator because she feels that she is not "being heard". The patient did share that she is having back pain. Along with concerns regarding child.   Kennieth Francois 06/11/2023, 9:23 PM

## 2023-06-11 NOTE — Group Note (Signed)
Recreation Therapy Group Note   Group Topic:Self-Esteem  Group Date: 06/11/2023 Start Time: 1014 End Time: 1055 Facilitators: Edy Mcbane-McCall, LRT, CTRS Location: [address] Dayroom   Group Topic: Self Esteem    Goal Area(s) Addresses:  Patient will appropriately identify what self esteem is.  Patient will create a shield of armor describing themselves.  Patient will successfully identify positive attributes about themselves.  Patient will acknowledge benefit of improved self-esteem.  Patient will follow instructions on 1st prompt.    Intervention / Activity: Self-Esteem Shield. Patient attended a recreation therapy group session focused on self esteem. Patient identified what self esteem is, and why it is important to have high self esteem during group discussion. LRT gave patiens a sheet with a breakdown of what each quadrant was to represent. Patient was asked to create their own shield to show off their unique attributes, four quadrants reflected the following:  The Upper Left quadrant- reasons they are unique/special. The Upper Right quadrant- things that they love to do. The Lower Left quadrant- goals for their future. The Lower Right quadrant- character words that describe them.    Patients were provided sheets with the shield printed on them and colored pencils, markers and crayons to complete the activity.  Patients and writer had group related discussions while individually working on their activity.  Patients were debriefed on the importance of healthy self esteem and offered a handout for ways to increase self esteem.    Education: Self esteem, Communication, Positive self-talk, Discharge Planning   Education Outcome: Acknowledges education/Verbalizes understanding of Education/In group clarification offered/Needs further education   Affect/Mood: Flat   Participation Level: Minimal   Participation Quality: Independent   Behavior: Distracted and Withdrawn    Speech/Thought Process: Barely audible    Insight: Fair   Judgement: Fair    Modes of Intervention: Art   Patient Response to Interventions:  Receptive   Education Outcome:  In group clarification offered    Clinical Observations/Individualized Feedback: Pt was quiet and withdrawn. Pt spent most of group looking out the window and appearing to be in deep thought. Pt finally completed that activity. Pt wrote "every child is unique" in her crest and put "life is precious" at the bottom.     Plan: Continue to engage patient in RT group sessions 2-3x/week.   Sruti Ayllon-McCall, LRT,CTRS 06/11/2023 12:00 PM

## 2023-06-11 NOTE — Plan of Care (Signed)
  Problem: Activity: Goal: Interest or engagement in activities will improve Outcome: Progressing Goal: Sleeping patterns will improve Outcome: Progressing   Problem: Safety: Goal: Periods of time without injury will increase Outcome: Progressing   Problem: Education: Goal: Emotional status will improve Outcome: Not Progressing Goal: Mental status will improve Outcome: Not Progressing

## 2023-06-11 NOTE — Progress Notes (Signed)
[facility] LCSW Progress Note   06/11/2023    3:44 PM  Angel [name]      Type of Note: [facility] DSS    CSW had to leave anther voicemail for a return call back regarding patient child whereabouts. CSW will continue to assist.     Signed:   Jacob Moores, MSW, [facility] 06/11/2023 3:44 PM

## 2023-06-12 DIAGNOSIS — F323 Major depressive disorder, single episode, severe with psychotic features: Secondary | ICD-10-CM | POA: Diagnosis not present

## 2023-06-12 MED ORDER — ONDANSETRON 4 MG PO TBDP
4.0000 mg | ORAL_TABLET | Freq: Three times a day (TID) | ORAL | Status: DC | PRN
  Administered 2023-06-12: 4 mg via ORAL
  Filled 2023-06-12: qty 1

## 2023-06-12 NOTE — Group Note (Signed)
Recreation Therapy Group Note   Group Topic:Other  Group Date: 06/12/2023 Start Time: 1030 End Time: 1100 Facilitators: Joahan Swatzell-McCall, LRT,CTRS Location: [address] Dayroom   Goal Area(s) Addresses:  Patient will effectively work in a team with other group members. Patient will verbalize importance of using appropriate problem solving techniques.  Patient will identify positive change associated with effective problem solving skills.   Group Description: Brain Teasers. Patients were grouped together and given two sheets of brain teasers. Patients were given 15 minutes to work together to solve the puzzles presented on the worksheets. LRT went over answers to the puzzles with patients when their time was up.    Education Outcome: Acknowledges understanding/In group clarification offered/Needs additional education.    Affect/Mood: N/A   Participation Level: Did not attend    Clinical Observations/Individualized Feedback:    Plan: Continue to engage patient in RT group sessions 2-3x/week.   Angel [name], LRT,CTRS 06/12/2023 1:20 PM

## 2023-06-12 NOTE — Progress Notes (Addendum)
[facility] MD Progress Note  06/12/2023 4:19 PM Kentrell Fromme  MRN:  [id number]  Principal Problem: Major depressive disorder, single episode, severe with psychotic features (HCC) Diagnosis: Principal Problem:   Major depressive disorder, single episode, severe with psychotic features (HCC) Active Problems:   MDD (major depressive disorder)   Insomnia  Reason for admission:  Angel [name] is a 32 yo [place] female with prior mental health diagnoses of MDD & GAD who initially presented to the [facility] ER on 9/21 with complaints of chest pain. Pt presented to the ER with her toddler & DSS was contacted & child was later picked up by a friend of estranged husband due to [address] order in place. Psychiatry was consulted, and pt was deemed to lack good decision making capacity sufficient enough to take care of herself or of her child.  She was recommended for inpatient behavioral health admission for treatment and stabilization of her mental status.  Patient was transferred to this [facility] on 9/22.  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  Her thoughts are mostly illogical.  He does clarify some of her psychiatric history including postpartum depression and then also other symptoms such as "I was praying.  I was crying so much.  The police came to my house.  They were going to get me a shot and I told them I did not want a shot. And they the left my house".  The patient is unsure of where her daughter is.  Later in the afternoon, per social work, the judge granted custody to the child's father and the child is with the father.  Patient states his sleep is better and nursing confirms this.  Appetite is okay.  Concentration is poor.  She is very anxious and irritable.  Mood is dysphoric.  She denies any SI or HI.  She continues to lack insight into her illness she states "I do not have bipolar.  I do not have bipolar because my sister is handicapped..  The sister I told you about  yesterday.  I do not have bipolar.  My mom was born in 26 and I was born in [birth year].  These were leap years.  My child started walking while I was at the chiropractor's office.  Everything is connected.  I do not have any psychiatric problem."      Total Time spent with patient: 25 minutes  Past Psychiatric History: See H&P.  It seems that the patient might have had bipolar depression in the past.  Denies any previous psychiatric hospitalization or medication treatments.   Past Medical History:  Past Medical History:  Diagnosis Date   Kidney stones    PCOS (polycystic ovarian syndrome)     Past Surgical History:  Procedure Laterality Date   APPENDECTOMY  2012   in [place]   CESAREAN SECTION N/A 03/13/2022   Procedure: CESAREAN SECTION;  Surgeon: Milas Hock, MD;  Location: [facility] LD ORS;  Service: Obstetrics;  Laterality: N/A;   Kidney stone removal  2021   and 2014   RHINOPLASTY     Family History:  Family History  Problem Relation Age of Onset   Hypertension Mother    Kidney Stones Mother    Hypertension Father    Kidney Stones Father    Thyroid disease Sister    Seizures Sister    Polycystic ovary syndrome Sister    Family Psychiatric  History: See H & P  Social History:  Social History  Substance and Sexual Activity  Alcohol Use No     Social History   Substance and Sexual Activity  Drug Use No    Social History   Socioeconomic History   Marital status: Legally Separated    Spouse name: pt declined   Number of children: 1   Years of education: Not on file   Highest education level: Patient refused  Occupational History   Not on file  Tobacco Use   Smoking status: Never   Smokeless tobacco: Never  Vaping Use   Vaping status: Never Used  Substance and Sexual Activity   Alcohol use: No   Drug use: No   Sexual activity: Not Currently    Birth control/protection: None  Other Topics Concern   Not on file  Social History Narrative   Not on file    Social Determinants of Health   Financial Resource Strain: Not on file  Food Insecurity: Food Insecurity Present (06/07/2023)   Hunger Vital Sign    Worried About Running Out of Food in the Last Year: Sometimes true    Ran Out of Food in the Last Year: Never true  Transportation Needs: No Transportation Needs (06/07/2023)   PRAPARE - Administrator, Civil Service (Medical): No    Lack of Transportation (Non-Medical): No  Physical Activity: Not on file  Stress: Not on file  Social Connections: Not on file    Current Medications: Current Facility-Administered Medications  Medication Dose Route Frequency Provider Last Rate Last Admin   acetaminophen (TYLENOL) tablet 650 mg  650 mg Oral Q6H PRN Jackalyn Haith, Harrold Donath, MD       alum & mag hydroxide-simeth (MAALOX/MYLANTA) 200-200-20 MG/5ML suspension 30 mL  30 mL Oral Q4H PRN Kyah Buesing, Harrold Donath, MD       diphenhydrAMINE (BENADRYL) capsule 50 mg  50 mg Oral TID PRN Phineas Inches, MD       Or   diphenhydrAMINE (BENADRYL) injection 50 mg  50 mg Intramuscular TID PRN Phineas Inches, MD   50 mg at 06/09/23 2956   hydrOXYzine (ATARAX) tablet 25 mg  25 mg Oral Q4H PRN Armandina Stammer I, NP   25 mg at 06/10/23 2059   LORazepam (ATIVAN) tablet 0.5 mg  0.5 mg Oral Q8H Jahel Wavra, MD   0.5 mg at 06/12/23 0820   LORazepam (ATIVAN) tablet 1 mg  1 mg Oral TID PRN Starleen Blue, NP       magnesium hydroxide (MILK OF MAGNESIA) suspension 30 mL  30 mL Oral Daily PRN Kindall Swaby, Harrold Donath, MD       menthol-cetylpyridinium (CEPACOL) lozenge 3 mg  1 lozenge Oral PRN Starleen Blue, NP       OLANZapine zydis (ZYPREXA) disintegrating tablet 5 mg  5 mg Oral TID PRN Derrisha Foos, Harrold Donath, MD       Or   OLANZapine (ZYPREXA) injection 5 mg  5 mg Intramuscular TID PRN Ulonda Klosowski, Harrold Donath, MD       OLANZapine zydis (ZYPREXA) disintegrating tablet 10 mg  10 mg Oral Daily Rayne Loiseau, MD   10 mg at 06/12/23 0820   Or   OLANZapine (ZYPREXA)  injection 5 mg  5 mg Intramuscular Daily Madoline Bhatt, Harrold Donath, MD       OLANZapine zydis (ZYPREXA) disintegrating tablet 15 mg  15 mg Oral QHS Mckenzie Toruno, Harrold Donath, MD   15 mg at 06/11/23 2043   Or   OLANZapine (ZYPREXA) injection 5 mg  5 mg Intramuscular QHS Phineas Inches, MD  Lab Results:  Results for orders placed or performed during the hospital encounter of 06/07/23 (from the past 48 hour(s))  CBC with Differential/Platelet     Status: None   Collection Time: 06/10/23  6:31 PM  Result Value Ref Range   WBC 5.5 4.0 - 10.5 K/uL   RBC 4.12 3.87 - 5.11 MIL/uL   Hemoglobin 12.4 12.0 - 15.0 g/dL   HCT 28.4 13.2 - 44.0 %   MCV 94.7 80.0 - 100.0 fL   MCH 30.1 26.0 - 34.0 pg   MCHC 31.8 30.0 - 36.0 g/dL   RDW 10.2 72.5 - 36.6 %   Platelets 263 150 - 400 K/uL   nRBC 0.0 0.0 - 0.2 %   Neutrophils Relative % 52 %   Neutro Abs 2.8 1.7 - 7.7 K/uL   Lymphocytes Relative 39 %   Lymphs Abs 2.2 0.7 - 4.0 K/uL   Monocytes Relative 7 %   Monocytes Absolute 0.4 0.1 - 1.0 K/uL   Eosinophils Relative 1 %   Eosinophils Absolute 0.1 0.0 - 0.5 K/uL   Basophils Relative 1 %   Basophils Absolute 0.0 0.0 - 0.1 K/uL   Immature Granulocytes 0 %   Abs Immature Granulocytes 0.01 0.00 - 0.07 K/uL    Comment: Performed at [facility], [address], [place], [place] [address]  VITAMIN D 25 Hydroxy (Vit-D Deficiency, Fractures)     Status: None   Collection Time: 06/10/23  6:31 PM  Result Value Ref Range   Vit D, 25-Hydroxy 35.33 30 - 100 ng/mL    Comment: (NOTE) Vitamin D deficiency has been defined by the Institute of Medicine  and an Endocrine Society practice guideline as a level of serum 25-OH  vitamin D less than 20 ng/mL (1,2). The Endocrine Society went on to  further define vitamin D insufficiency as a level between 21 and 29  ng/mL (2).  1. IOM (Institute of Medicine). 2010. Dietary reference intakes for  calcium and D. [place]: [employer]. 2. Holick MF, Binkley , Bischoff-Ferrari HA, et al. Evaluation,  treatment, and prevention of vitamin D deficiency: an Endocrine  Society clinical practice guideline, JCEM. 2011 Jul; 96(7): 1911-30.  Performed at [facility], [address], [place], [place] [address]     Blood Alcohol level:  No results found for: "ETH"  Metabolic Disorder Labs: Lab Results  Component Value Date   HGBA1C 4.8 06/09/2023   MPG 91.06 06/09/2023   No results found for: "PROLACTIN" Lab Results  Component Value Date   CHOL 147 06/09/2023   TRIG 57 06/09/2023   HDL 47 06/09/2023   CHOLHDL 3.1 06/09/2023   VLDL 11 06/09/2023   LDLCALC 89 06/09/2023   LDLCALC 43 08/14/2021   Physical Findings: AIMS:0 CIWA: n/a COWS:  n/a  Musculoskeletal: Strength & Muscle Tone: within normal limits Gait & Station: normal Patient leans: N/A  Psychiatric Specialty Exam:  Presentation  General Appearance:  Casual  Eye Contact: Fair  Speech: Pressured  Speech Volume: Increased  Handedness: Right   Mood and Affect  Mood: Anxious; Irritable  Affect: Congruent; Full Range; Tearful   Thought Process  Thought Processes: Disorganized  Descriptions of Associations:Tangential  Orientation:Full (Time, Place and Person)  Thought Content:Tangential; Paranoid Ideation  History of Schizophrenia/Schizoaffective disorder:No  Duration of Psychotic Symptoms:Less than six months  Hallucinations:Hallucinations: None  Ideas of Reference:Paranoia  Suicidal Thoughts:Suicidal Thoughts: No  Homicidal Thoughts:Homicidal Thoughts: No   Sensorium  Memory: Immediate  Fair; Recent Fair; Remote Fair  Judgment: Impaired  Insight: Lacking   Executive Functions  Concentration: Poor  Attention Span: Poor  Recall: Poor  Fund of Knowledge: Poor  Language: Poor   Psychomotor Activity  Psychomotor Activity: Psychomotor Activity: Normal   Assets   Assets: Resilience   Sleep  Sleep: Sleep: Fair    Physical Exam: Physical Exam Constitutional:      General: She is not in acute distress.    Appearance: Normal appearance. She is not toxic-appearing.  Musculoskeletal:        General: Normal range of motion.     Cervical back: Normal range of motion.  Neurological:     General: No focal deficit present.     Mental Status: She is alert and oriented to person, place, and time.     Motor: No weakness.     Gait: Gait normal.    Review of Systems  Constitutional: Negative.   HENT: Negative.    Eyes: Negative.   Respiratory: Negative.    Cardiovascular: Negative.   Gastrointestinal: Negative.   Genitourinary: Negative.   Musculoskeletal: Negative.   Skin: Negative.   Neurological: Negative.   Psychiatric/Behavioral:  Positive for depression. Negative for hallucinations, memory loss, substance abuse and suicidal ideas. The patient is nervous/anxious and has insomnia.    Blood pressure 95/65, pulse 98, temperature 97.9 F (36.6 C), temperature source Oral, resp. rate 16, height 5\' 2"  (1.575 m), weight 41.7 kg, last menstrual period 06/04/2023, SpO2 100%, not currently breastfeeding. Body mass index is 16.83 kg/m.  Treatment Plan Summary: Daily contact with patient to assess and evaluate symptoms and progress in treatment and Medication management.    Assessment:  Diagnosis list: Bipolar disorder, type I, current episode is manic  Plan:    Medication plan: FORCED MED ORDER IN PLACE: -Continue Zyprexa 10 mg twice daily.  If patient refuses oral medication, she is to be given IM medication per forced medication order.   -Continue Ativan 0.5 mg 3 times daily for anxiety   Labs reviewed: UA with large leukocytes, rare bacteria, but pt is asymptomatic.  Other PRNS -Continue Tylenol 650 mg every 6 hours PRN for mild pain -Continue Maalox 30 ml Q 4 hrs PRN for indigestion -Continue MOM 30 ml po Q 6 hrs for  constipation   Safety and Monitoring: Voluntary admission to inpatient psychiatric unit for safety, stabilization and treatment Daily contact with patient to assess and evaluate symptoms and progress in treatment Patient's case to be discussed in multi-disciplinary team meeting Observation Level : q15 minute checks Vital signs: q12 hours Precautions: Safety   Discharge Planning: Social work and case management to assist with discharge planning and identification of hospital follow-up needs prior to discharge Estimated LOS: 5-7 days Discharge Concerns: Need to establish a safety plan; Medication compliance and effectiveness Discharge Goals: Return home with outpatient referrals for mental health follow-up including medication management/psychotherapy   I certify that inpatient services furnished can reasonably be expected to improve the patient's condition.    Cristy Hilts, MD 06/12/2023, 4:19 PM  Total Time Spent in Direct Patient Care:  I personally spent 40 minutes on the unit in direct patient care. The direct patient care time included face-to-face time with the patient, reviewing the patient's chart, communicating with other professionals, and coordinating care. Greater than 50% of this time was spent in counseling or coordinating care with the patient regarding goals of hospitalization, psycho-education, and discharge planning needs.   Phineas Inches, MD Psychiatrist

## 2023-06-12 NOTE — Progress Notes (Signed)
Pt coming out her room multiple times being disrespectful towards staff, pt yelling, pt asked to keep her voice down due to waking up other patients.

## 2023-06-12 NOTE — Progress Notes (Signed)
Psychoeducational Group Note  Date:  06/12/2023 Time:  2054  Group Topic/Focus:  Wrap-Up Group:   The focus of this group is to help patients review their daily goal of treatment and discuss progress on daily workbooks.  Participation Level: Did Not Attend  Participation Quality:  Not Applicable  Affect:  Not Applicable  Cognitive:  Not Applicable  Insight:  Not Applicable  Engagement in Group: Not Applicable  Additional Comments:  The patient did not attend group this evening. The patient had been loud and argumentative prior to group.   Hazle Coca S 06/12/2023, 8:54 PM

## 2023-06-12 NOTE — Progress Notes (Signed)
Pt coming out her room requesting something for pain, pt offered Tylenol, pt refused, pt then sat in the floor, pt encouraged to go back to her room, pt came out a few minutes later requesting something for nausea pt continues to talk derogatory towards Clinical research associate. "I need you to call the doctor now" , writer tried to explain that the doctor needs to be messaged and I have to wait for a response . Pt continued to be demanding and when [employer] not do what she wanted at the moment she continued to get upset and be loud in the hallway , Clinical research associate informed pt she can not wake up other patients on the unit.

## 2023-06-12 NOTE — Progress Notes (Addendum)
[facility] LCSW Progress Note   06/12/2023    3:29 PM  Angel [name]      Type of Note: DSS CPS   CSW was able to find out patient caseworker who is [name] [phone]. CSW provided patient with this information, so she can also reach out and call . CSW has made numerous calls and left voicemail's today for this caseworker. CSW will continue to assist.   CSW did get verbal permission from patient to call her husband. CSW reached out to [employer] @ [phone] and he shared that the judge granted him emergency custody of child today, and the child is with him, he had took her to an appointment. Husband stated that the 50-B was mutual between him and patient, but the one for the child was taken out by judge. CSW will continue to assist.     Signed:   Jacob Moores, MSW, [facility] 06/12/2023 3:29 PM

## 2023-06-12 NOTE — Progress Notes (Signed)
Pt continues to need frequent re-direction due to pt talking derogatory towards staff, pt demanding of what staff needs to do for her, pt encouraged to talk to the doctor    06/12/23 2200  Psych Admission Type (Psych Patients Only)  Admission Status Voluntary  Psychosocial Assessment  Patient Complaints Worrying;Irritability  Eye Contact Suspiciousness  Facial Expression Worried  Affect Anxious;Angry  Speech Argumentative;Pressured  Interaction Defensive;Demanding;Intrusive  Motor Activity Restless;Fidgety  Appearance/Hygiene Improved  Behavior Characteristics Irritable;Restless;Anxious  Mood Suspicious;Preoccupied  Aggressive Behavior  Effect No apparent injury  Thought Process  Coherency Circumstantial;Disorganized;Tangential  Content Blaming others;Preoccupation  Delusions None reported or observed  Perception Derealization  Hallucination None reported or observed  Judgment Poor  Confusion Mild  Danger to Self  Current suicidal ideation? Denies  Danger to Others  Danger to Others None reported or observed

## 2023-06-12 NOTE — Progress Notes (Signed)
Pt refused Ativan this morning, pt encouraged to talk to the doctor, pt talked to pt as if she were better than writer"you have a limited brain" . Pt encourage to talk to the SW also

## 2023-06-12 NOTE — Plan of Care (Signed)
  Problem: Activity: Goal: Interest or engagement in activities will improve Outcome: Progressing   Problem: Health Behavior/Discharge Planning: Goal: Compliance with treatment plan for underlying cause of condition will improve Outcome: Progressing   Problem: Safety: Goal: Periods of time without injury will increase Outcome: Progressing   

## 2023-06-12 NOTE — Plan of Care (Signed)
  Problem: Activity: Goal: Interest or engagement in activities will improve Outcome: Progressing Goal: Sleeping patterns will improve Outcome: Progressing   Problem: Safety: Goal: Periods of time without injury will increase Outcome: Progressing   Problem: Education: Goal: Emotional status will improve Outcome: Not Progressing Goal: Mental status will improve Outcome: Not Progressing   Problem: Coping: Goal: Ability to demonstrate self-control will improve Outcome: Not Progressing

## 2023-06-12 NOTE — Progress Notes (Signed)
Patient came out of her room recently talking loudly. She argued with the staff that she is drowsy but that she can not sleep. She also states that the medication is making her more depressed. The patient is demanding that the staff call her by her name and that she should be able to talk to her doctor and this late hour. She eventually sat down on the floor in  the hallway and refused to get up and return to her room.

## 2023-06-12 NOTE — Progress Notes (Signed)
Pt continues to come out the room an sits by the telephone, pt encouraged to go back to her room , pt continues to be argumentative and boisterous towards staff

## 2023-06-13 ENCOUNTER — Encounter (HOSPITAL_COMMUNITY): Payer: Self-pay

## 2023-06-13 DIAGNOSIS — F323 Major depressive disorder, single episode, severe with psychotic features: Secondary | ICD-10-CM | POA: Diagnosis not present

## 2023-06-13 MED ORDER — OLANZAPINE 10 MG PO TBDP
20.0000 mg | ORAL_TABLET | Freq: Every day | ORAL | Status: DC
  Administered 2023-06-13 – 2023-06-19 (×7): 20 mg via ORAL
  Filled 2023-06-13 (×10): qty 2

## 2023-06-13 MED ORDER — OLANZAPINE 10 MG IM SOLR
5.0000 mg | Freq: Every day | INTRAMUSCULAR | Status: DC
  Filled 2023-06-13 (×5): qty 10

## 2023-06-13 MED ORDER — LORAZEPAM 0.5 MG PO TABS
0.2500 mg | ORAL_TABLET | Freq: Three times a day (TID) | ORAL | Status: AC
  Administered 2023-06-13 – 2023-06-14 (×3): 0.25 mg via ORAL
  Filled 2023-06-13 (×3): qty 1

## 2023-06-13 NOTE — Plan of Care (Signed)
  Problem: Coping: Goal: Ability to demonstrate self-control will improve Outcome: Progressing

## 2023-06-13 NOTE — [facility] Group Notes (Signed)
Spirituality group facilitated by Kathleen Argue, [facility].  Group Description: Group focused on topic of hope. Patients participated in facilitated discussion around topic, connecting with one another around experiences and definitions for hope. Group members engaged with visual explorer photos, reflecting on what hope looks like for them today. Group engaged in discussion around how their definitions of hope are present today in hospital.  Modalities: Psycho-social ed, Adlerian, Narrative, MI  Patient Progress: Angel [name] attended group and actively engaged and participated in group conversation and activities.  She shared about her family, but she monopolized the group and had to be redirected.

## 2023-06-13 NOTE — Group Note (Signed)
Date:  06/13/2023 Time:  9:41 PM  Group Topic/Focus:  Wrap-Up Group:   The focus of this group is to help patients review their daily goal of treatment and discuss progress on daily workbooks.    Participation Level:  Active  Participation Quality:  Appropriate  Affect:  Labile  Cognitive:  Appropriate  Insight: Appropriate  Engagement in Group:  Engaged  Modes of Intervention:  Education and Exploration  Additional Comments:  Patient attended and participated in group tonight. She reports that today she eat and slept well. She also missed her child.  Lita Mains [facility] 06/13/2023, 9:41 PM

## 2023-06-13 NOTE — Progress Notes (Signed)
   06/13/23 2045  Psych Admission Type (Psych Patients Only)  Admission Status Voluntary  Psychosocial Assessment  Patient Complaints Sadness;Suspiciousness;Worrying  Eye Contact Fair  Facial Expression Anxious;Worried  Affect Appropriate to circumstance;Anxious;Preoccupied  Speech Pressured  Interaction Forwards little  Motor Activity Fidgety;Restless  Appearance/Hygiene Improved  Behavior Characteristics Appropriate to situation;Anxious;Guarded  Mood Anxious;Suspicious;Preoccupied  Aggressive Behavior  Effect No apparent injury  Thought Process  Coherency Circumstantial;Tangential  Content Blaming others;Preoccupation  Delusions None reported or observed  Perception Derealization  Hallucination None reported or observed  Judgment Poor  Confusion Mild  Danger to Self  Current suicidal ideation? Denies

## 2023-06-13 NOTE — Plan of Care (Signed)
Problem: Education: Goal: Knowledge of [place] General Education information/materials will improve Outcome: Not Progressing Goal: Emotional status will improve Outcome: Not Progressing Goal: Mental status will improve Outcome: Not Progressing Goal: Verbalization of understanding the information provided will improve Outcome: Not Progressing   Problem: Activity: Goal: Interest or engagement in activities will improve Outcome: Not Progressing Goal: Sleeping patterns will improve Outcome: Not Progressing   Problem: Coping: Goal: Ability to verbalize frustrations and anger appropriately will improve Outcome: Not Progressing Goal: Ability to demonstrate self-control will improve Outcome: Not Progressing   Problem: Health Behavior/Discharge Planning: Goal: Identification of resources available to assist in meeting health care needs will improve Outcome: Not Progressing Goal: Compliance with treatment plan for underlying cause of condition will improve Outcome: Not Progressing   Problem: Physical Regulation: Goal: Ability to maintain clinical measurements within normal limits will improve Outcome: Not Progressing   Problem: Safety: Goal: Periods of time without injury will increase Outcome: Not Progressing   Problem: Education: Goal: Ability to state activities that reduce stress will improve Outcome: Not Progressing   Problem: Coping: Goal: Ability to identify and develop effective coping behavior will improve Outcome: Not Progressing   Problem: Self-Concept: Goal: Ability to identify factors that promote anxiety will improve Outcome: Not Progressing Goal: Level of anxiety will decrease Outcome: Not Progressing Goal: Ability to modify response to factors that promote anxiety will improve Outcome: Not Progressing   Problem: Education: Goal: Utilization of techniques to improve thought processes will improve Outcome: Not Progressing Goal: Knowledge of the  prescribed therapeutic regimen will improve Outcome: Not Progressing   Problem: Activity: Goal: Interest or engagement in leisure activities will improve Outcome: Not Progressing Goal: Imbalance in normal sleep/wake cycle will improve Outcome: Not Progressing   Problem: Coping: Goal: Coping ability will improve Outcome: Not Progressing Goal: Will verbalize feelings Outcome: Not Progressing   Problem: Health Behavior/Discharge Planning: Goal: Ability to make decisions will improve Outcome: Not Progressing Goal: Compliance with therapeutic regimen will improve Outcome: Not Progressing   Problem: Role Relationship: Goal: Will demonstrate positive changes in social behaviors and relationships Outcome: Not Progressing   Problem: Safety: Goal: Ability to disclose and discuss suicidal ideas will improve Outcome: Not Progressing Goal: Ability to identify and utilize support systems that promote safety will improve Outcome: Not Progressing   Problem: Self-Concept: Goal: Will verbalize positive feelings about self Outcome: Not Progressing Goal: Level of anxiety will decrease Outcome: Not Progressing   Problem: Education: Goal: Knowledge of General Education information will improve Description: Including pain rating scale, medication(s)/side effects and non-pharmacologic comfort measures Outcome: Not Progressing   Problem: Health Behavior/Discharge Planning: Goal: Ability to manage health-related needs will improve Outcome: Not Progressing   Problem: Clinical Measurements: Goal: Ability to maintain clinical measurements within normal limits will improve Outcome: Not Progressing Goal: Will remain free from infection Outcome: Not Progressing Goal: Diagnostic test results will improve Outcome: Not Progressing Goal: Respiratory complications will improve Outcome: Not Progressing Goal: Cardiovascular complication will be avoided Outcome: Not Progressing   Problem:  Activity: Goal: Risk for activity intolerance will decrease Outcome: Not Progressing   Problem: Nutrition: Goal: Adequate nutrition will be maintained Outcome: Not Progressing   Problem: Coping: Goal: Level of anxiety will decrease Outcome: Not Progressing   Problem: Elimination: Goal: Will not experience complications related to bowel motility Outcome: Not Progressing Goal: Will not experience complications related to urinary retention Outcome: Not Progressing   Problem: Pain Managment: Goal: General experience of comfort will improve Outcome: Not Progressing  Problem: Safety: Goal: Ability to remain free from injury will improve Outcome: Not Progressing   Problem: Skin Integrity: Goal: Risk for impaired skin integrity will decrease Outcome: Not Progressing

## 2023-06-13 NOTE — Group Note (Signed)
Date:  06/13/2023 Time:  10:36 AM  Group Topic/Focus:  Goals Group:   The focus of this group is to help patients establish daily goals to achieve during treatment and discuss how the patient can incorporate goal setting into their daily lives to aide in recovery.    Participation Level:  Active  Additional Comments:  Pt has a goal of being social and staying calm during the day. Pt also wants to focus on getting discharged and getting back to normal life.   Deforest Hoyles Zephaniah Enyeart 06/13/2023, 10:36 AM

## 2023-06-13 NOTE — Progress Notes (Signed)
[facility] MD Progress Note  06/13/2023 5:47 PM Sherian Valenza  MRN:  [id number]  Principal Problem: Major depressive disorder, single episode, severe with psychotic features (HCC) Diagnosis: Principal Problem:   Major depressive disorder, single episode, severe with psychotic features (HCC) Active Problems:   MDD (major depressive disorder)   Insomnia  Reason for admission: Angel [name] is a 32 yo [place] female with prior mental health diagnoses of MDD & GAD who initially presented to the [facility] ER on 9/21 with complaints of chest pain. Pt presented to the ER with her toddler & DSS was contacted & child was later picked up by a friend of estranged husband due to 81 B order in place. Psychiatry was consulted, and pt was deemed to lack good decision making capacity sufficient enough to take care of herself or of her child.  She was recommended for inpatient behavioral health admission for treatment and stabilization of her mental status.  Patient was transferred to this [facility] on 9/22.  24 hr chart review: Vital signs within normal limits, patient is compliant with p.o. medications, and has not required for the agitation protocol to be enforced in the past 24 hours.  Argumentative overnight, difficult to redirect as per nursing documentation.  Sleep was poor as per nursing documentation, but patient reports a good sleep quality.  Patient assessment note:  Patient remains with poor insight regarding her need for mental health stabilization and treatment, continues to exhibit poor judgment, is irrational during conversations, is argumentative, mood is irritable.  Patient is perseverative that her child was taken away from her and given to her ex-husband, and focuses on him smoking cigarettes, talks about her husband calling her out of her name, and telling her that he wants a child and not her.  She remains disorganized, and with flight of ideas, and a slightly less pressured, and slightly less  irritable as compared to time of admission.   Pt remains hypomanic, and we are continuing to adjust medications for the treatment of her mental status. We will taper off Ativan to help with mental clarity and increase Zyprexa to 20 mg nightly and 10 mg during the day to help with mood stabilization.  Forced medications remain in place until Monday 9/29.  Continuing other medications as listed below.  Patient denies SI/HI/AVH. presents with paranoia, is suspicious, and presents with delusions of persecution (continues to think that medications are meant to hurt her rather than help her, and thinks that her husband and his family are out to harm her).  No TD/EPS type symptoms found on assessment, and pt denies any feelings of stiffness. AIMS: 0.   Total Time spent with patient: 45 minutes  Past Psychiatric History: See H & P  Past Medical History:  Past Medical History:  Diagnosis Date   Kidney stones    PCOS (polycystic ovarian syndrome)     Past Surgical History:  Procedure Laterality Date   APPENDECTOMY  2012   in [place]   CESAREAN SECTION N/A 03/13/2022   Procedure: CESAREAN SECTION;  Surgeon: Milas Hock, MD;  Location: [facility] LD ORS;  Service: Obstetrics;  Laterality: N/A;   Kidney stone removal  2021   and 2014   RHINOPLASTY     Family History:  Family History  Problem Relation Age of Onset   Hypertension Mother    Kidney Stones Mother    Hypertension Father    Kidney Stones Father    Thyroid disease Sister    Seizures Sister  Polycystic ovary syndrome Sister    Family Psychiatric  History: See H & P Social History:  Social History   Substance and Sexual Activity  Alcohol Use No     Social History   Substance and Sexual Activity  Drug Use No    Social History   Socioeconomic History   Marital status: Legally Separated    Spouse name: pt declined   Number of children: 1   Years of education: Not on file   Highest education level: Patient refused   Occupational History   Not on file  Tobacco Use   Smoking status: Never   Smokeless tobacco: Never  Vaping Use   Vaping status: Never Used  Substance and Sexual Activity   Alcohol use: No   Drug use: No   Sexual activity: Not Currently    Birth control/protection: None  Other Topics Concern   Not on file  Social History Narrative   Not on file   Social Determinants of Health   Financial Resource Strain: Not on file  Food Insecurity: Food Insecurity Present (06/07/2023)   Hunger Vital Sign    Worried About Running Out of Food in the Last Year: Sometimes true    Ran Out of Food in the Last Year: Never true  Transportation Needs: No Transportation Needs (06/07/2023)   PRAPARE - Administrator, Civil Service (Medical): No    Lack of Transportation (Non-Medical): No  Physical Activity: Not on file  Stress: Not on file  Social Connections: Not on file   Sleep: Good  Appetite:  Fair  Current Medications: Current Facility-Administered Medications  Medication Dose Route Frequency Provider Last Rate Last Admin   acetaminophen (TYLENOL) tablet 650 mg  650 mg Oral Q6H PRN Massengill, Nathan, MD       alum & mag hydroxide-simeth (MAALOX/MYLANTA) 200-200-20 MG/5ML suspension 30 mL  30 mL Oral Q4H PRN Massengill, Harrold Donath, MD       diphenhydrAMINE (BENADRYL) capsule 50 mg  50 mg Oral TID PRN Phineas Inches, MD       Or   diphenhydrAMINE (BENADRYL) injection 50 mg  50 mg Intramuscular TID PRN Phineas Inches, MD   50 mg at 06/09/23 4098   hydrOXYzine (ATARAX) tablet 25 mg  25 mg Oral Q4H PRN Armandina Stammer I, NP   25 mg at 06/12/23 2037   LORazepam (ATIVAN) tablet 0.25 mg  0.25 mg Oral Q8H Massengill, Nathan, MD   0.25 mg at 06/13/23 1400   LORazepam (ATIVAN) tablet 1 mg  1 mg Oral TID PRN Starleen Blue, NP   1 mg at 06/12/23 2314   magnesium hydroxide (MILK OF MAGNESIA) suspension 30 mL  30 mL Oral Daily PRN Massengill, Harrold Donath, MD       menthol-cetylpyridinium  (CEPACOL) lozenge 3 mg  1 lozenge Oral PRN Starleen Blue, NP       OLANZapine zydis (ZYPREXA) disintegrating tablet 5 mg  5 mg Oral TID PRN Massengill, Harrold Donath, MD       Or   OLANZapine (ZYPREXA) injection 5 mg  5 mg Intramuscular TID PRN Massengill, Harrold Donath, MD       OLANZapine zydis (ZYPREXA) disintegrating tablet 10 mg  10 mg Oral Daily Massengill, Harrold Donath, MD   10 mg at 06/13/23 1191   Or   OLANZapine (ZYPREXA) injection 5 mg  5 mg Intramuscular Daily Massengill, Harrold Donath, MD       OLANZapine zydis (ZYPREXA) disintegrating tablet 20 mg  20 mg Oral QHS Phineas Inches, MD  Or   OLANZapine (ZYPREXA) injection 5 mg  5 mg Intramuscular QHS Massengill, Nathan, MD       ondansetron (ZOFRAN-ODT) disintegrating tablet 4 mg  4 mg Oral Q8H PRN Sindy Guadeloupe, NP   4 mg at 06/12/23 2314    Lab Results:  No results found for this or any previous visit (from the past 48 hour(s)).   Blood Alcohol level:  No results found for: "ETH"  Metabolic Disorder Labs: Lab Results  Component Value Date   HGBA1C 4.8 06/09/2023   MPG 91.06 06/09/2023   No results found for: "PROLACTIN" Lab Results  Component Value Date   CHOL 147 06/09/2023   TRIG 57 06/09/2023   HDL 47 06/09/2023   CHOLHDL 3.1 06/09/2023   VLDL 11 06/09/2023   LDLCALC 89 06/09/2023   LDLCALC 43 08/14/2021   Physical Findings: AIMS:0 CIWA: n/a COWS:  n/a  Musculoskeletal: Strength & Muscle Tone: within normal limits Gait & Station: normal Patient leans: N/A  Psychiatric Specialty Exam:  Presentation  General Appearance:  Appropriate for Environment; Fairly Groomed  Eye Contact: Fair  Speech: Clear and Coherent  Speech Volume: Normal  Handedness: Right   Mood and Affect  Mood: Anxious; Depressed; Irritable  Affect: Congruent   Thought Process  Thought Processes: Coherent  Descriptions of Associations:Intact  Orientation:Full (Time, Place and Person)  Thought Content:Illogical;  Perseveration; Paranoid Ideation  History of Schizophrenia/Schizoaffective disorder:No  Duration of Psychotic Symptoms:Less than six months  Hallucinations:Hallucinations: None  Ideas of Reference:Paranoia  Suicidal Thoughts:Suicidal Thoughts: No  Homicidal Thoughts:Homicidal Thoughts: No   Sensorium  Memory: Immediate Good  Judgment: Poor  Insight: Poor   Executive Functions  Concentration: Poor  Attention Span: Fair  Recall: Poor  Fund of Knowledge: Fair  Language: Fair   Psychomotor Activity  Psychomotor Activity: Psychomotor Activity: Normal    Assets  Assets: Resilience   Sleep  Sleep: Sleep: Fair    Physical Exam: Physical Exam Constitutional:      Appearance: Normal appearance.  Musculoskeletal:        General: Normal range of motion.     Cervical back: Normal range of motion.  Neurological:     General: No focal deficit present.     Mental Status: She is alert and oriented to person, place, and time.    Review of Systems  Constitutional: Negative.   HENT: Negative.    Eyes: Negative.   Respiratory: Negative.    Cardiovascular: Negative.   Gastrointestinal: Negative.   Genitourinary: Negative.   Musculoskeletal: Negative.   Skin: Negative.   Neurological: Negative.   Psychiatric/Behavioral:  Positive for depression and hallucinations. Negative for memory loss, substance abuse and suicidal ideas. The patient is nervous/anxious and has insomnia.    Blood pressure 102/66, pulse 97, temperature 97.9 F (36.6 C), temperature source Oral, resp. rate 16, height 5\' 2"  (1.575 m), weight 41.7 kg, last menstrual period 06/04/2023, SpO2 100%, not currently breastfeeding. Body mass index is 16.83 kg/m.  Treatment Plan Summary: Daily contact with patient to assess and evaluate symptoms and progress in treatment and Medication management.    Principal/active diagnoses.  Major depressive disorder, single episode, severe with  psychotic features.  R/o Bipolar 1 disorder, manic episode.   Plan: The risks/benefits/side-effects/alternatives to the medications in use were discussed in detail with the patient and time was given for patient's questions. The patient consents to medication trial.    Medications: FORCED MED ORDER IN PLACE: -Increase Zyprexa to 20 mg PO OR 5 mg IM  in the mornings-  -Continue HS Zyprexa 10 mg PO nightly or 5 mg IM for psychosis and mood stabilization-MUST GIVE IM IF PATIENT REFUSES PO -Taper off Ativan:Reduce to Ativan 0.25 mg 3 doses then stop. -Continue hydroxyzine 25 mg po tid prn for anxiety.  Agitation protocols: Cont as recommended;  -Continue Benadryl 50 mg po or IM tid prn. -Continue agitation protocol: tid prn Zyprexa PO or IM 5 mg TID -Previously discontinued Ativan on the agitation Protocol  Labs reviewed: UA with large leukocytes, rare bacteria, but pt is asymptomatic.  Other PRNS -Continue Tylenol 650 mg every 6 hours PRN for mild pain -Continue Maalox 30 ml Q 4 hrs PRN for indigestion -Continue MOM 30 ml po Q 6 hrs for constipation   Safety and Monitoring: Voluntary admission to inpatient psychiatric unit for safety, stabilization and treatment Daily contact with patient to assess and evaluate symptoms and progress in treatment Patient's case to be discussed in multi-disciplinary team meeting Observation Level : q15 minute checks Vital signs: q12 hours Precautions: Safety   Discharge Planning: Social work and case management to assist with discharge planning and identification of hospital follow-up needs prior to discharge Estimated LOS: 5-7 days Discharge Concerns: Need to establish a safety plan; Medication compliance and effectiveness Discharge Goals: Return home with outpatient referrals for mental health follow-up including medication management/psychotherapy   Observation Level/Precautions:  15 minute checks  Laboratory:   Will obtain a Lipid panel, hgba1c,  lipid panel, TSH, U/A, UDS & a repeat cmp  Psychotherapy: Enrolled in the group sessions.   Medications:  See [facility]  Consultations: As needed.   Discharge Concerns: Safety, mood stability.   Estimated LOS: 7 days.  Other: NA.      Physician Treatment Plan for Primary Diagnosis: Major depressive disorder, single episode, severe with psychotic features (HCC) Long Term Goal(s): Improvement in symptoms so as ready for discharge   Short Term Goals: Ability to identify changes in lifestyle to reduce recurrence of condition will improve, Ability to verbalize feelings will improve, Ability to disclose and discuss suicidal ideas, and Ability to demonstrate self-control will improve   Physician Treatment Plan for Secondary Diagnosis: Principal Problem:   Major depressive disorder, single episode, severe with psychotic features (HCC) Active Problems:   MDD (major depressive disorder)   Long Term Goal(s): Improvement in symptoms so as ready for discharge   Short Term Goals: Ability to identify and develop effective coping behaviors will improve, Ability to maintain clinical measurements within normal limits will improve, Compliance with prescribed medications will improve, and Ability to identify triggers associated with substance abuse/mental health issues will improve   I certify that inpatient services furnished can reasonably be expected to improve the patient's condition.    Starleen Blue, NP 06/13/2023, 5:47 PM Patient ID: [name], female   DOB: 1991-07-12, 32 y.o.   MRN: [id number] Patient ID: Samaia Iwata, female   DOB: [birth year], 32 y.o.   MRN: [id number]

## 2023-06-13 NOTE — Group Note (Signed)
Recreation Therapy Group Note   Group Topic:Problem Solving  Group Date: 06/13/2023 Start Time: 1040 End Time: 1105 Facilitators: Angel [name], LRT,CTRS Location: [address] Dayroom   Group Topic: Communication, Team Building, Problem Solving  Goal Area(s) Addresses:  Patient will effectively work with peer towards shared goal.  Patient will identify skills used to make activity successful.  Patient will share challenges and verbalize solution-driven approaches used. Patient will identify how skills used during activity can be used to reach post d/c goals.   Intervention: STEM Activity   Group Description: [employer]. Patients were provided the following materials: 5 drinking straws, 5 rubber bands, 5 paper clips, 2 index cards, 2 drinking cups, and 2 toilet paper rolls. Using the provided materials patients were asked to build a launching mechanism to launch a ping pong ball across the room, approximately 10 feet. Patients were divided into teams of 3-5. Instructions required all materials be incorporated into the device, functionality of items left to the peer group's discretion.  Education: Pharmacist, community, Scientist, physiological, Air cabin crew, Building control surveyor.   Education Outcome: Acknowledges education/In group clarification offered/Needs additional education.   Affect/Mood: Flat   Participation Level: Moderate   Participation Quality: Maximum Cues   Behavior: Distracted, Disruptive, and Defiant   Speech/Thought Process: Barely audible    Insight: Poor   Judgement: Poor   Modes of Intervention: STEM Activity   Patient Response to Interventions:  Challenging    Education Outcome:  In group clarification offered    Clinical Observations/Individualized Feedback: Pt worked with peer for a moment in trying to complete the activity. Pt needed repeated redirection from intruding in other people's personal space and repeatedly asking for hot water. Pt would  correct her actions for short periods of time before repeating the same behavior.     Plan: Continue to engage patient in RT group sessions 2-3x/week.   Angel [name], LRT,CTRS  06/13/2023 1:24 PM

## 2023-06-13 NOTE — BH IP Treatment Plan (Signed)
Interdisciplinary Treatment and Diagnostic Plan Update  06/13/2023 Time of Session: 9:35 AM ( UPDATE)  Angel [name] MRN: [id number]  Principal Diagnosis: Major depressive disorder, single episode, severe with psychotic features (HCC)  Secondary Diagnoses: Principal Problem:   Major depressive disorder, single episode, severe with psychotic features (HCC) Active Problems:   MDD (major depressive disorder)   Insomnia   Current Medications:  Current Facility-Administered Medications  Medication Dose Route Frequency Provider Last Rate Last Admin   acetaminophen (TYLENOL) tablet 650 mg  650 mg Oral Q6H PRN Massengill, Harrold Donath, MD       alum & mag hydroxide-simeth (MAALOX/MYLANTA) 200-200-20 MG/5ML suspension 30 mL  30 mL Oral Q4H PRN Massengill, Harrold Donath, MD       diphenhydrAMINE (BENADRYL) capsule 50 mg  50 mg Oral TID PRN Phineas Inches, MD       Or   diphenhydrAMINE (BENADRYL) injection 50 mg  50 mg Intramuscular TID PRN Phineas Inches, MD   50 mg at 06/09/23 4098   hydrOXYzine (ATARAX) tablet 25 mg  25 mg Oral Q4H PRN Armandina Stammer I, NP   25 mg at 06/12/23 2037   LORazepam (ATIVAN) tablet 0.25 mg  0.25 mg Oral Q8H Massengill, Nathan, MD       LORazepam (ATIVAN) tablet 1 mg  1 mg Oral TID PRN Starleen Blue, NP   1 mg at 06/12/23 2314   magnesium hydroxide (MILK OF MAGNESIA) suspension 30 mL  30 mL Oral Daily PRN Massengill, Harrold Donath, MD       menthol-cetylpyridinium (CEPACOL) lozenge 3 mg  1 lozenge Oral PRN Starleen Blue, NP       OLANZapine zydis (ZYPREXA) disintegrating tablet 5 mg  5 mg Oral TID PRN Phineas Inches, MD       Or   OLANZapine (ZYPREXA) injection 5 mg  5 mg Intramuscular TID PRN Massengill, Harrold Donath, MD       OLANZapine zydis (ZYPREXA) disintegrating tablet 10 mg  10 mg Oral Daily Massengill, Nathan, MD   10 mg at 06/13/23 1191   Or   OLANZapine (ZYPREXA) injection 5 mg  5 mg Intramuscular Daily Massengill, Harrold Donath, MD       OLANZapine zydis (ZYPREXA)  disintegrating tablet 20 mg  20 mg Oral QHS Massengill, Nathan, MD       Or   OLANZapine (ZYPREXA) injection 5 mg  5 mg Intramuscular QHS Massengill, Nathan, MD       ondansetron (ZOFRAN-ODT) disintegrating tablet 4 mg  4 mg Oral Q8H PRN Sindy Guadeloupe, NP   4 mg at 06/12/23 2314   PTA Medications: No medications prior to admission.    Patient Stressors: Other: Patient currently feeling anxious and overwhelmed with caring for her daughter and herself    Patient Strengths: Ability for insight  Average or above average intelligence  Supportive family/friends   Treatment Modalities: Medication Management, Group therapy, Case management,  1 to 1 session with clinician, Psychoeducation, Recreational therapy.   Physician Treatment Plan for Primary Diagnosis: Major depressive disorder, single episode, severe with psychotic features (HCC) Long Term Goal(s): Improvement in symptoms so as ready for discharge   Short Term Goals: Ability to identify and develop effective coping behaviors will improve Ability to maintain clinical measurements within normal limits will improve Compliance with prescribed medications will improve Ability to identify triggers associated with substance abuse/mental health issues will improve Ability to identify changes in lifestyle to reduce recurrence of condition will improve Ability to verbalize feelings will improve Ability to disclose and discuss suicidal ideas  Ability to demonstrate self-control will improve  Medication Management: Evaluate patient's response, side effects, and tolerance of medication regimen.  Therapeutic Interventions: 1 to 1 sessions, Unit Group sessions and Medication administration.  Evaluation of Outcomes: Progressing  Physician Treatment Plan for Secondary Diagnosis: Principal Problem:   Major depressive disorder, single episode, severe with psychotic features (HCC) Active Problems:   MDD (major depressive disorder)    Insomnia  Long Term Goal(s): Improvement in symptoms so as ready for discharge   Short Term Goals: Ability to identify and develop effective coping behaviors will improve Ability to maintain clinical measurements within normal limits will improve Compliance with prescribed medications will improve Ability to identify triggers associated with substance abuse/mental health issues will improve Ability to identify changes in lifestyle to reduce recurrence of condition will improve Ability to verbalize feelings will improve Ability to disclose and discuss suicidal ideas Ability to demonstrate self-control will improve     Medication Management: Evaluate patient's response, side effects, and tolerance of medication regimen.  Therapeutic Interventions: 1 to 1 sessions, Unit Group sessions and Medication administration.  Evaluation of Outcomes: Progressing   RN Treatment Plan for Primary Diagnosis: Major depressive disorder, single episode, severe with psychotic features (HCC) Long Term Goal(s): Knowledge of disease and therapeutic regimen to maintain health will improve  Short Term Goals: Ability to remain free from injury will improve, Ability to verbalize frustration and anger appropriately will improve, Ability to participate in decision making will improve, Ability to verbalize feelings will improve, Ability to identify and develop effective coping behaviors will improve, and Compliance with prescribed medications will improve  Medication Management: RN will administer medications as ordered by provider, will assess and evaluate patient's response and provide education to patient for prescribed medication. RN will report any adverse and/or side effects to prescribing provider.  Therapeutic Interventions: 1 on 1 counseling sessions, Psychoeducation, Medication administration, Evaluate responses to treatment, Monitor vital signs and CBGs as ordered, Perform/monitor CIWA, COWS, AIMS and Fall  Risk screenings as ordered, Perform wound care treatments as ordered.  Evaluation of Outcomes: Not Progressing   LCSW Treatment Plan for Primary Diagnosis: Major depressive disorder, single episode, severe with psychotic features (HCC) Long Term Goal(s): Safe transition to appropriate next level of care at discharge, Engage patient in therapeutic group addressing interpersonal concerns.  Short Term Goals: Engage patient in aftercare planning with referrals and resources, Increase social support, Increase emotional regulation, Facilitate acceptance of mental health diagnosis and concerns, Identify triggers associated with mental health/substance abuse issues, and Increase skills for wellness and recovery  Therapeutic Interventions: Assess for all discharge needs, 1 to 1 time with Social worker, Explore available resources and support systems, Assess for adequacy in community support network, Educate family and significant other(s) on suicide prevention, Complete Psychosocial Assessment, Interpersonal group therapy.  Evaluation of Outcomes: Not Progressing   Progress in Treatment: Attending groups: Yes. Participating in groups: No. Taking medication as prescribed: Yes. Toleration medication: Yes. Family/Significant other contact made: No, will contact:  Mom or Dad  Patient understands diagnosis: No. Discussing patient identified problems/goals with staff: No. Medical problems stabilized or resolved: No. Denies suicidal/homicidal ideation: Yes. Issues/concerns pertinent self-inventory: No.   New problem(s) identified: No, Describe:  none reported   New Short Term/Long Term Goal(s):medication stabilization, elimination of SI thoughts, development of comprehensive mental wellness plan.      Patient Goals:  Pt was not appropriate for Tx team meeting.    Discharge Plan or Barriers: Patient recently admitted. CSW will continue to  follow and assess for appropriate referrals and possible  discharge planning.      Reason for Continuation of Hospitalization: Anxiety Depression Medication stabilization   Estimated Length of Stay:3-4 days  Last 3 [place] Suicide Severity Risk Score: Flowsheet Row [facility] (Current[facility] Most recent reading at 06/07/2023 11:00 PM ED from 06/07/2023 in [facility] at [facility] Most recent reading at 06/07/2023  6:47 AM ED from 01/23/2023 in [facility] [place] Most recent reading at 01/23/2023  5:21 PM  C-SSRS RISK CATEGORY No Risk No Risk No Risk       Last PHQ 2/9 Scores:    02/20/2023   11:52 AM 03/05/2022    9:46 AM 09/20/2021    1:30 PM  Depression screen PHQ 2/9  Decreased Interest 0  2  Down, Depressed, Hopeless 1 0 1  PHQ - 2 Score 1 0 3  Altered sleeping 1 0 2  Tired, decreased energy 1 2 2   Change in appetite 1 1 3   Feeling bad or failure about yourself  1 3 2   Trouble concentrating 0 1 0  Moving slowly or fidgety/restless 1 1 1   Suicidal thoughts 0 0 0  PHQ-9 Score 6 8 13     Scribe for Treatment Team: Beather Arbour 06/13/2023 3:43 PM

## 2023-06-14 DIAGNOSIS — F323 Major depressive disorder, single episode, severe with psychotic features: Secondary | ICD-10-CM | POA: Diagnosis not present

## 2023-06-14 NOTE — Progress Notes (Signed)
   06/14/23 2003  Psych Admission Type (Psych Patients Only)  Admission Status Voluntary  Psychosocial Assessment  Patient Complaints Anxiety;Worrying  Eye Contact Fair  Facial Expression Anxious;Worried  Affect Appropriate to circumstance;Anxious;Preoccupied  Speech Pressured  Interaction Forwards little  Motor Activity Fidgety;Restless  Appearance/Hygiene Improved  Behavior Characteristics Appropriate to situation;Anxious  Mood Preoccupied  Aggressive Behavior  Effect No apparent injury  Thought Process  Coherency Circumstantial;Tangential  Content Blaming others;Preoccupation  Delusions None reported or observed  Perception Derealization  Hallucination None reported or observed  Judgment Poor  Confusion Mild  Danger to Self  Current suicidal ideation? Denies

## 2023-06-14 NOTE — Progress Notes (Addendum)
Patient presents with labile mood (jokes and laughs at times but also turns tearful and very sad). Patient appears focused on discharge date in order to see her daughter. Attempted to contact social worker with no success which made patient worry about not seeing her daughter ever again. Patient reminded that she could contact her social worker again on Monday. Patient denies SI,HI, and A/V/H. Patient still presenting tangential at times with slightly pressured speech but has been overall cooperative,sociable, and med compliant. No force meds required. Patient attended recreation with no issues. No s/s of current distress.

## 2023-06-14 NOTE — Plan of Care (Signed)
  Problem: Health Behavior/Discharge Planning: Goal: Compliance with treatment plan for underlying cause of condition will improve Outcome: Progressing   Problem: Safety: Goal: Periods of time without injury will increase Outcome: Progressing   

## 2023-06-14 NOTE — Progress Notes (Signed)
[facility] MD Progress Note  06/14/2023 4:46 PM Angel [name]  MRN:  [id number]  Principal Problem: Major depressive disorder, single episode, severe with psychotic features (HCC) Diagnosis: Principal Problem:   Major depressive disorder, single episode, severe with psychotic features (HCC) Active Problems:   MDD (major depressive disorder)   Insomnia  Reason for admission: Angel [name] is a 32 yo [place] female with prior mental health diagnoses of MDD & GAD who initially presented to the [name] ER on 9/21 with complaints of chest pain. Pt presented to the ER with her toddler & DSS was contacted & child was later picked up by a friend of estranged husband due to 57 B order in place. Psychiatry was consulted, and pt was deemed to lack good decision making capacity sufficient enough to take care of herself or of her child.  She was recommended for inpatient behavioral health admission for treatment and stabilization of her mental status.  Patient was transferred to this [name] on 9/22.  24 hr chart review: Vital signs within normal limits, with the exception of HR which was slightly  elevated earlier today morning at 118. Has not required for the agitation protocol to be enforced in the past 24 hours.  Anxious overnight, preoccupied, pressured, restless and fidgety as per nursing documentation.  Sleep hours not documented.  Patient reports a good night sleep last night.  Patient assessment note:  Patient's presentation today is a slight improvement as compared to yesterday. Speech today is less rapid, less tangential, less disorganized. She continues to present with flight of ideas, answers questions with non linear responses & perseverates about unrelated topics; talks about her husband using the hands that he smokes with to carry the child with when those hands are supposed to be pure. Talks about a time when Dettol (a cleaning solution) when she was angry after her parents made a statement that she  disagreed with when she was residing in [place]. Pt then talks about her disabled sister, her husband calling her a thief in the past, amongst other topics.  She reports +AH of God's voice which is "everywhere".  Attending psychiatrist attempted to have patient elaborate more on this, but she does not.  She denies SI, denies HI, denies VH, denies paranoia, denies first rank symptoms.  No TD/EPS type symptoms found on assessment, and pt denies any feelings of stiffness. AIMS: 0.  Medication changes where completed yesterday, and patient reports that medications are helping her sleep, and she seems to have more mental clarity today than she had yesterday even though she remains disorganized.  She also seems calmer and less irritable as compared to yesterday.  We will keep medications as listed below:  Continue Zyprexa 20 mg nightly and 10 mg in the mornings for psychosis  Patient has not required forced medications implementation.  Total Time spent with patient: 45 minutes  Past Psychiatric History: See H & P  Past Medical History:  Past Medical History:  Diagnosis Date   Kidney stones    PCOS (polycystic ovarian syndrome)     Past Surgical History:  Procedure Laterality Date   APPENDECTOMY  2012   in [place]   CESAREAN SECTION N/A 03/13/2022   Procedure: CESAREAN SECTION;  Surgeon: Milas Hock, MD;  Location: [facility] LD ORS;  Service: Obstetrics;  Laterality: N/A;   Kidney stone removal  2021   and 2014   RHINOPLASTY     Family History:  Family History  Problem Relation Age of Onset  Hypertension Mother    Kidney Stones Mother    Hypertension Father    Kidney Stones Father    Thyroid disease Sister    Seizures Sister    Polycystic ovary syndrome Sister    Family Psychiatric  History: See H & P Social History:  Social History   Substance and Sexual Activity  Alcohol Use No     Social History   Substance and Sexual Activity  Drug Use No    Social History   Socioeconomic  History   Marital status: Legally Separated    Spouse name: pt declined   Number of children: 1   Years of education: Not on file   Highest education level: Patient refused  Occupational History   Not on file  Tobacco Use   Smoking status: Never   Smokeless tobacco: Never  Vaping Use   Vaping status: Never Used  Substance and Sexual Activity   Alcohol use: No   Drug use: No   Sexual activity: Not Currently    Birth control/protection: None  Other Topics Concern   Not on file  Social History Narrative   Not on file   Social Determinants of Health   Financial Resource Strain: Not on file  Food Insecurity: Food Insecurity Present (06/07/2023)   Hunger Vital Sign    Worried About Running Out of Food in the Last Year: Sometimes true    Ran Out of Food in the Last Year: Never true  Transportation Needs: No Transportation Needs (06/07/2023)   PRAPARE - Administrator, Civil Service (Medical): No    Lack of Transportation (Non-Medical): No  Physical Activity: Not on file  Stress: Not on file  Social Connections: Not on file   Sleep: Good  Appetite:  Fair  Current Medications: Current Facility-Administered Medications  Medication Dose Route Frequency Provider Last Rate Last Admin   acetaminophen (TYLENOL) tablet 650 mg  650 mg Oral Q6H PRN Massengill, Nathan, MD       alum & mag hydroxide-simeth (MAALOX/MYLANTA) 200-200-20 MG/5ML suspension 30 mL  30 mL Oral Q4H PRN Massengill, Harrold Donath, MD       diphenhydrAMINE (BENADRYL) capsule 50 mg  50 mg Oral TID PRN Massengill, Harrold Donath, MD       Or   diphenhydrAMINE (BENADRYL) injection 50 mg  50 mg Intramuscular TID PRN Phineas Inches, MD   50 mg at 06/09/23 1610   hydrOXYzine (ATARAX) tablet 25 mg  25 mg Oral Q4H PRN Armandina Stammer I, NP   25 mg at 06/12/23 2037   LORazepam (ATIVAN) tablet 1 mg  1 mg Oral TID PRN Starleen Blue, NP   1 mg at 06/12/23 2314   magnesium hydroxide (MILK OF MAGNESIA) suspension 30 mL  30 mL Oral  Daily PRN Massengill, Harrold Donath, MD       menthol-cetylpyridinium (CEPACOL) lozenge 3 mg  1 lozenge Oral PRN Starleen Blue, NP       OLANZapine zydis (ZYPREXA) disintegrating tablet 5 mg  5 mg Oral TID PRN Massengill, Harrold Donath, MD       Or   OLANZapine (ZYPREXA) injection 5 mg  5 mg Intramuscular TID PRN Massengill, Harrold Donath, MD       OLANZapine zydis (ZYPREXA) disintegrating tablet 10 mg  10 mg Oral Daily Massengill, Harrold Donath, MD   10 mg at 06/14/23 0855   Or   OLANZapine (ZYPREXA) injection 5 mg  5 mg Intramuscular Daily Massengill, Nathan, MD       OLANZapine zydis (ZYPREXA) disintegrating tablet  20 mg  20 mg Oral QHS Massengill, Nathan, MD   20 mg at 06/13/23 2036   Or   OLANZapine (ZYPREXA) injection 5 mg  5 mg Intramuscular QHS Massengill, Nathan, MD       ondansetron (ZOFRAN-ODT) disintegrating tablet 4 mg  4 mg Oral Q8H PRN Sindy Guadeloupe, NP   4 mg at 06/12/23 2314    Lab Results:  No results found for this or any previous visit (from the past 48 hour(s)).   Blood Alcohol level:  No results found for: "ETH"  Metabolic Disorder Labs: Lab Results  Component Value Date   HGBA1C 4.8 06/09/2023   MPG 91.06 06/09/2023   No results found for: "PROLACTIN" Lab Results  Component Value Date   CHOL 147 06/09/2023   TRIG 57 06/09/2023   HDL 47 06/09/2023   CHOLHDL 3.1 06/09/2023   VLDL 11 06/09/2023   LDLCALC 89 06/09/2023   LDLCALC 43 08/14/2021   Physical Findings: AIMS:0 CIWA: n/a COWS:  n/a  Musculoskeletal: Strength & Muscle Tone: within normal limits Gait & Station: normal Patient leans: N/A  Psychiatric Specialty Exam:  Presentation  General Appearance:  Casual; Disheveled  Eye Contact: Fair  Speech: Other (comment) (rapid)  Speech Volume: Increased  Handedness: Right   Mood and Affect  Mood: Anxious  Affect: Congruent   Thought Process  Thought Processes: Disorganized; Irrevelant  Descriptions of  Associations:Tangential  Orientation:Partial  Thought Content:Illogical  History of Schizophrenia/Schizoaffective disorder:No  Duration of Psychotic Symptoms:Greater than six months  Hallucinations:Hallucinations: Auditory Description of Auditory Hallucinations: God's voice  Ideas of Reference:Paranoia  Suicidal Thoughts:Suicidal Thoughts: No  Homicidal Thoughts:Homicidal Thoughts: No   Sensorium  Memory: Recent Fair  Judgment: Poor  Insight: Poor   Executive Functions  Concentration: Fair  Attention Span: Fair  Recall: Fair  Fund of Knowledge: Fair  Language: Fair   Psychomotor Activity  Psychomotor Activity: Psychomotor Activity: Normal    Assets  Assets: Resilience   Sleep  Sleep: Sleep: Good    Physical Exam: Physical Exam Constitutional:      Appearance: Normal appearance.  Musculoskeletal:        General: Normal range of motion.     Cervical back: Normal range of motion.  Neurological:     General: No focal deficit present.     Mental Status: She is alert and oriented to person, place, and time.    Review of Systems  Constitutional: Negative.   HENT: Negative.    Eyes: Negative.   Respiratory: Negative.    Cardiovascular: Negative.   Gastrointestinal: Negative.   Genitourinary: Negative.   Musculoskeletal: Negative.   Skin: Negative.   Neurological: Negative.   Psychiatric/Behavioral:  Positive for depression and hallucinations. Negative for memory loss, substance abuse and suicidal ideas. The patient is nervous/anxious and has insomnia.    Blood pressure 102/66, pulse 99, temperature 98.3 F (36.8 C), temperature source Oral, resp. rate 16, height 5\' 2"  (1.575 m), weight 41.7 kg, last menstrual period 06/04/2023, SpO2 100%, not currently breastfeeding. Body mass index is 16.83 kg/m.  Treatment Plan Summary: Daily contact with patient to assess and evaluate symptoms and progress in treatment and Medication  management.    Principal/active diagnoses.  Major depressive disorder, single episode, severe with psychotic features.  R/o Bipolar 1 disorder, manic episode.   Plan: The risks/benefits/side-effects/alternatives to the medications in use were discussed in detail with the patient and time was given for patient's questions. The patient consents to medication trial.    Medications: FORCED  MED ORDER IN PLACE: -Continue Zyprexa 20 mg nightly and 10 mg in the mornings (Repeat EKG entered for today, 09/28 with upward titrations of antipsychotics) MUST GIVE IM zyprexa 5 mg each time IF PATIENT REFUSES PO -Tapered off Ativan:Reduced to Ativan 0.25 mg 3 doses then stop (ended 9/27). -Continue hydroxyzine 25 mg po tid prn for anxiety.  Agitation protocols:  -Continue Benadryl 50 mg po or IM tid prn. -Continue agitation protocol: tid prn Zyprexa PO or IM 5 mg TID -Previously discontinued Ativan on the agitation Protocol  Labs reviewed: UA with large leukocytes, rare bacteria, but pt is asymptomatic.  Other PRNS -Continue Tylenol 650 mg every 6 hours PRN for mild pain -Continue Maalox 30 ml Q 4 hrs PRN for indigestion -Continue MOM 30 ml po Q 6 hrs for constipation   Safety and Monitoring: Voluntary admission to inpatient psychiatric unit for safety, stabilization and treatment Daily contact with patient to assess and evaluate symptoms and progress in treatment Patient's case to be discussed in multi-disciplinary team meeting Observation Level : q15 minute checks Vital signs: q12 hours Precautions: Safety   Discharge Planning: Social work and case management to assist with discharge planning and identification of hospital follow-up needs prior to discharge Estimated LOS: 5-7 days Discharge Concerns: Need to establish a safety plan; Medication compliance and effectiveness Discharge Goals: Return home with outpatient referrals for mental health follow-up including medication  management/psychotherapy   Observation Level/Precautions:  15 minute checks  Laboratory:   Will obtain a Lipid panel, hgba1c, lipid panel, TSH, U/A, UDS & a repeat cmp  Psychotherapy: Enrolled in the group sessions.   Medications:  See [name]  Consultations: As needed.   Discharge Concerns: Safety, mood stability.   Estimated LOS: 7 days.  Other: NA.      Physician Treatment Plan for Primary Diagnosis: Major depressive disorder, single episode, severe with psychotic features (HCC) Long Term Goal(s): Improvement in symptoms so as ready for discharge   Short Term Goals: Ability to identify changes in lifestyle to reduce recurrence of condition will improve, Ability to verbalize feelings will improve, Ability to disclose and discuss suicidal ideas, and Ability to demonstrate self-control will improve   Physician Treatment Plan for Secondary Diagnosis: Principal Problem:   Major depressive disorder, single episode, severe with psychotic features (HCC) Active Problems:   MDD (major depressive disorder)   Long Term Goal(s): Improvement in symptoms so as ready for discharge   Short Term Goals: Ability to identify and develop effective coping behaviors will improve, Ability to maintain clinical measurements within normal limits will improve, Compliance with prescribed medications will improve, and Ability to identify triggers associated with substance abuse/mental health issues will improve   I certify that inpatient services furnished can reasonably be expected to improve the patient's condition.    Starleen Blue, NP 06/14/2023, 4:46 PM Patient ID: Angel [name], female   DOB: 25-Oct-1990, 32 y.o.   MRN: [id number]

## 2023-06-14 NOTE — Group Note (Signed)
Date:  06/14/2023 Time:  9:17 PM  Group Topic/Focus:  Wrap-Up Group:   The focus of this group is to help patients review their daily goal of treatment and discuss progress on daily workbooks.    Participation Level:  Active  Participation Quality:  Appropriate  Affect:  Appropriate  Cognitive:  Appropriate  Insight: Appropriate  Engagement in Group:  Engaged  Modes of Intervention:  Education and Exploration  Additional Comments:  Patient attended and participated in group tonight. She reports that  she like her interest in education. She always want to learn. She is also funny.   Lita Mains [facility] 06/14/2023, 9:17 PM

## 2023-06-15 DIAGNOSIS — F323 Major depressive disorder, single episode, severe with psychotic features: Secondary | ICD-10-CM | POA: Diagnosis not present

## 2023-06-15 MED ORDER — ENSURE ENLIVE PO LIQD
237.0000 mL | Freq: Three times a day (TID) | ORAL | Status: DC | PRN
  Administered 2023-06-15 (×2): 237 mL via ORAL
  Filled 2023-06-15 (×2): qty 237

## 2023-06-15 NOTE — Progress Notes (Signed)
   06/15/23 2110  Psych Admission Type (Psych Patients Only)  Admission Status Voluntary  Psychosocial Assessment  Patient Complaints Worrying;Anxiety  Eye Contact Fair  Facial Expression Anxious  Affect Preoccupied  Speech Pressured  Interaction Needy;Assertive  Motor Activity Fidgety  Appearance/Hygiene Improved  Behavior Characteristics Cooperative  Mood Preoccupied  Aggressive Behavior  Effect No apparent injury  Thought Process  Coherency Circumstantial  Content Preoccupation;Blaming others (pt continues to ask about her daughter and inquire if SW can help her.)  Delusions None reported or observed  Perception Derealization  Hallucination None reported or observed  Judgment Poor  Confusion Mild  Danger to Self  Current suicidal ideation? Denies  Agreement Not to Harm Self Yes  Description of Agreement verbal  Danger to Others  Danger to Others None reported or observed

## 2023-06-15 NOTE — Plan of Care (Signed)
  Problem: Education: Goal: Knowledge of [facility] Education information/materials will improve Outcome: Progressing Goal: Emotional status will improve Outcome: Progressing Goal: Mental status will improve Outcome: Progressing Goal: Verbalization of understanding the information provided will improve Outcome: Progressing   

## 2023-06-15 NOTE — Progress Notes (Signed)
   06/15/23 1027  Psych Admission Type (Psych Patients Only)  Admission Status Voluntary  Psychosocial Assessment  Patient Complaints None  Eye Contact Fair  Facial Expression Anxious  Affect Preoccupied  Speech Pressured  Interaction Forwards little  Motor Activity Restless  Appearance/Hygiene Improved  Behavior Characteristics Cooperative  Mood Preoccupied  Thought Process  Coherency Circumstantial;Tangential  Content Preoccupation  Delusions None reported or observed  Perception Derealization  Hallucination None reported or observed  Judgment Poor  Confusion Mild  Danger to Self  Current suicidal ideation? Denies  Agreement Not to Harm Self Yes  Description of Agreement verbal  Danger to Others  Danger to Others None reported or observed

## 2023-06-15 NOTE — Group Note (Signed)
Date:  06/15/2023 Time:  9:22 PM  Group Topic/Focus:  Wrap-Up Group:   The focus of this group is to help patients review their daily goal of treatment and discuss progress on daily workbooks.    Participation Level:  Active  Participation Quality:  Appropriate  Affect:  Appropriate  Cognitive:  Appropriate  Insight: Appropriate  Engagement in Group:  Engaged  Modes of Intervention:  Education and Exploration  Additional Comments:  Patient attended and participated in group tonight. She reports that her day was good. He child turned 15 months today. She had lolsts of support from her caregivers here. The staff has been good to her. She spoke with her doctor.  She learnt that she have to take her medication in order to get home to her daughter.  Lita Mains [facility] 06/15/2023, 9:22 PM

## 2023-06-15 NOTE — [facility] Suicide Risk Assessment (Addendum)
[facility] INPATIENT:  Family/Significant Other Suicide Prevention Education  Suicide Prevention Education:  Contact Attempts: [place] & [place], (father and mother [phone] has been identified by the patient as the family member/significant other with whom the patient will be residing, and identified as the person(s) who will aid the patient in the event of a mental health crisis.  With written consent from the patient, two attempts were made to provide suicide prevention education, prior to and/or following the patient's discharge.  We were unsuccessful in providing suicide prevention education.  A suicide education pamphlet was given to the patient to share with family/significant other.   CSW used language solution to interpret [name], unable to call stated that "the party can not be reach".  Date and time of first attempt:06/15/2023/10:23Am   Angel [name] 06/15/2023, 10:21 AM

## 2023-06-15 NOTE — Progress Notes (Signed)
[facility] MD Progress Note  06/15/2023 1:19 PM Angel [name]  MRN:  [id number]  Principal Problem: Major depressive disorder, single episode, severe with psychotic features (HCC) Diagnosis: Principal Problem:   Major depressive disorder, single episode, severe with psychotic features (HCC) Active Problems:   MDD (major depressive disorder)   Insomnia  Reason for admission: Angel [name] is a 32 yo [place] female with prior mental health diagnoses of MDD & GAD who initially presented to the [facility] ER on 9/21 with complaints of chest pain. Pt presented to the ER with her toddler & DSS was contacted & child was later picked up by a friend of estranged husband due to 34 B order in place. Psychiatry was consulted, and pt was deemed to lack good decision making capacity sufficient enough to take care of herself or of her child.  She was recommended for inpatient behavioral health admission for treatment and stabilization of her mental status.  Patient was transferred to this [facility] on 9/22.  24 hr chart review: Vital signs within normal limits, with the exception of HR which was slightly elevated earlier today morning at 102, but this is an improvement in her HR. Has not required for the agitation protocol to be enforced in the past 24 hours, and has not required the forced medication order as she is taking her medications by mouth.  Anxious overnight, preoccupied, pressured, restless and fidgety as per nursing documentation & reports.  Sleep hours not documented. Patient reports a good night's sleep.  Patient assessment note:  During today's assessment, patient presents with preoccupation and perseveration over her baby; repeatedly states: "I want to see my baby. I want to hold my child. I want to hug her. She is in [place] with [place]. He smokes. You separated mother and child". Upon entering the pt's room, she is lying in bed and answering questions with eyes closed, she is tearful, verbalizes  feelings of sadness, states that she feels "down" due to not being able to see her child.  She seems to have some difficulty processing some information, and seems to have some clouding of consciousness. Information regarding her child has already been discussed with her multiple times by Clinical research associate, and CSW, but she remains perseverative about child, and at one point states that Clinical research associate and hospital staff took her child from her.  Pt has made some improvement since hospitalization AEB speech being less pressured, being less disorganized, but she continues to present with symptoms that render her not yet ready for discharge. She is continuing to present with flight of ideas, a depressed, irritable mood & remains illogical with poor insight into her current mental status. Pt is continuing to present with delusions of persecution as well as paranoia, states that she wants to go home because she does not feel safe at the hospital. She states: "You already took my baby Angel [name]".  Pt  states "I am still hungry. I don't like cold food. I want my hot [place] food". Pt is asked if there is a family member than can be called to bring her some food, but states that every one is busy. She is offered other choices from out cafeteria such as salads, and sandwiches, but states that she does not like cold food. Ensure nutritional shakes entered for nutritional supplementation, even though pt states that she does not like cold drinks either. She is being encouraged to eat an to stay hydrated.  No TD/EPS type symptoms found on assessment, and pt denies  any feelings of stiffness. AIMS: 0. We are continuing medications as Listed below with no changes today. We are continuing to monitor, and will revisit discharge planning on a daily basis. Continuous hospitalization remains necessary at this so that mental status can be sufficiently treated and stabilized prior to discharge.  Total Time spent with patient: 45 minutes  Past  Psychiatric History: See H & P  Past Medical History:  Past Medical History:  Diagnosis Date   Kidney stones    PCOS (polycystic ovarian syndrome)     Past Surgical History:  Procedure Laterality Date   APPENDECTOMY  2012   in [place]   CESAREAN SECTION N/A 03/13/2022   Procedure: CESAREAN SECTION;  Surgeon: Milas Hock, MD;  Location: [facility] LD ORS;  Service: Obstetrics;  Laterality: N/A;   Kidney stone removal  2021   and 2014   RHINOPLASTY     Family History:  Family History  Problem Relation Age of Onset   Hypertension Mother    Kidney Stones Mother    Hypertension Father    Kidney Stones Father    Thyroid disease Sister    Seizures Sister    Polycystic ovary syndrome Sister    Family Psychiatric  History: See H & P Social History:  Social History   Substance and Sexual Activity  Alcohol Use No     Social History   Substance and Sexual Activity  Drug Use No    Social History   Socioeconomic History   Marital status: Legally Separated    Spouse name: pt declined   Number of children: 1   Years of education: Not on file   Highest education level: Patient refused  Occupational History   Not on file  Tobacco Use   Smoking status: Never   Smokeless tobacco: Never  Vaping Use   Vaping status: Never Used  Substance and Sexual Activity   Alcohol use: No   Drug use: No   Sexual activity: Not Currently    Birth control/protection: None  Other Topics Concern   Not on file  Social History Narrative   Not on file   Social Determinants of Health   Financial Resource Strain: Not on file  Food Insecurity: Food Insecurity Present (06/07/2023)   Hunger Vital Sign    Worried About Running Out of Food in the Last Year: Sometimes true    Ran Out of Food in the Last Year: Never true  Transportation Needs: No Transportation Needs (06/07/2023)   PRAPARE - Administrator, Civil Service (Medical): No    Lack of Transportation (Non-Medical): No  Physical  Activity: Not on file  Stress: Not on file  Social Connections: Not on file   Sleep: Good  Appetite:  Fair  Current Medications: Current Facility-Administered Medications  Medication Dose Route Frequency Provider Last Rate Last Admin   acetaminophen (TYLENOL) tablet 650 mg  650 mg Oral Q6H PRN Massengill, Nathan, MD       alum & mag hydroxide-simeth (MAALOX/MYLANTA) 200-200-20 MG/5ML suspension 30 mL  30 mL Oral Q4H PRN Massengill, Harrold Donath, MD       diphenhydrAMINE (BENADRYL) capsule 50 mg  50 mg Oral TID PRN Massengill, Harrold Donath, MD       Or   diphenhydrAMINE (BENADRYL) injection 50 mg  50 mg Intramuscular TID PRN Phineas Inches, MD   50 mg at 06/09/23 0829   feeding supplement (ENSURE ENLIVE / ENSURE PLUS) liquid 237 mL  237 mL Oral TID WC PRN Massengill, Harrold Donath,  MD   237 mL at 06/15/23 1050   hydrOXYzine (ATARAX) tablet 25 mg  25 mg Oral Q4H PRN Armandina Stammer I, NP   25 mg at 06/14/23 2035   LORazepam (ATIVAN) tablet 1 mg  1 mg Oral TID PRN Starleen Blue, NP   1 mg at 06/12/23 2314   magnesium hydroxide (MILK OF MAGNESIA) suspension 30 mL  30 mL Oral Daily PRN Massengill, Harrold Donath, MD       menthol-cetylpyridinium (CEPACOL) lozenge 3 mg  1 lozenge Oral PRN Starleen Blue, NP       OLANZapine zydis (ZYPREXA) disintegrating tablet 5 mg  5 mg Oral TID PRN Phineas Inches, MD       Or   OLANZapine (ZYPREXA) injection 5 mg  5 mg Intramuscular TID PRN Massengill, Harrold Donath, MD       OLANZapine zydis (ZYPREXA) disintegrating tablet 10 mg  10 mg Oral Daily Massengill, Harrold Donath, MD   10 mg at 06/15/23 1610   Or   OLANZapine (ZYPREXA) injection 5 mg  5 mg Intramuscular Daily Massengill, Harrold Donath, MD       OLANZapine zydis (ZYPREXA) disintegrating tablet 20 mg  20 mg Oral QHS Massengill, Harrold Donath, MD   20 mg at 06/14/23 2035   Or   OLANZapine (ZYPREXA) injection 5 mg  5 mg Intramuscular QHS Massengill, Harrold Donath, MD       ondansetron (ZOFRAN-ODT) disintegrating tablet 4 mg  4 mg Oral Q8H PRN Sindy Guadeloupe, NP   4 mg at 06/12/23 2314    Lab Results:  No results found for this or any previous visit (from the past 48 hour(s)).   Blood Alcohol level:  No results found for: "ETH"  Metabolic Disorder Labs: Lab Results  Component Value Date   HGBA1C 4.8 06/09/2023   MPG 91.06 06/09/2023   No results found for: "PROLACTIN" Lab Results  Component Value Date   CHOL 147 06/09/2023   TRIG 57 06/09/2023   HDL 47 06/09/2023   CHOLHDL 3.1 06/09/2023   VLDL 11 06/09/2023   LDLCALC 89 06/09/2023   LDLCALC 43 08/14/2021   Physical Findings: AIMS:0 CIWA: n/a COWS:  n/a  Musculoskeletal: Strength & Muscle Tone: within normal limits Gait & Station: normal Patient leans: N/A  Psychiatric Specialty Exam:  Presentation  General Appearance:  Casual; Fairly Groomed  Eye Contact: Fair  Speech: Clear and Coherent; Normal Rate  Speech Volume: Normal  Handedness: Right   Mood and Affect  Mood: Depressed; Irritable  Affect: Congruent   Thought Process  Thought Processes: Disorganized  Descriptions of Associations:Intact  Orientation:Partial  Thought Content:Illogical  History of Schizophrenia/Schizoaffective disorder:No  Duration of Psychotic Symptoms:Greater than six months  Hallucinations:Hallucinations: None Description of Auditory Hallucinations: God's voice  Ideas of Reference:Paranoia; Percusatory  Suicidal Thoughts:Suicidal Thoughts: No  Homicidal Thoughts:Homicidal Thoughts: No   Sensorium  Memory: Recent Poor; Immediate Poor  Judgment: Poor  Insight: Poor   Executive Functions  Concentration: Fair  Attention Span: Fair  Recall: Fair  Fund of Knowledge: Fair  Language: Fair   Psychomotor Activity  Psychomotor Activity: Psychomotor Activity: Normal    Assets  Assets: Resilience   Sleep  Sleep: Sleep: Fair    Physical Exam: Physical Exam Constitutional:      Appearance: Normal appearance.   Musculoskeletal:        General: Normal range of motion.     Cervical back: Normal range of motion.  Neurological:     General: No focal deficit present.     Mental Status:  She is alert and oriented to person, place, and time.    Review of Systems  Constitutional: Negative.   HENT: Negative.    Eyes: Negative.   Respiratory: Negative.    Cardiovascular: Negative.   Gastrointestinal: Negative.   Genitourinary: Negative.   Musculoskeletal: Negative.   Skin: Negative.   Neurological: Negative.   Psychiatric/Behavioral:  Positive for depression and hallucinations. Negative for memory loss, substance abuse and suicidal ideas. The patient is nervous/anxious and has insomnia.    Blood pressure 106/71, pulse (!) 101, temperature 97.6 F (36.4 C), temperature source Oral, resp. rate 16, height 5\' 2"  (1.575 m), weight 41.7 kg, last menstrual period 06/04/2023, SpO2 97%, not currently breastfeeding. Body mass index is 16.83 kg/m.  Treatment Plan Summary: Daily contact with patient to assess and evaluate symptoms and progress in treatment and Medication management.    Principal/active diagnoses.  Major depressive disorder, single episode, severe with psychotic features.  R/o Bipolar 1 disorder, manic episode.   Plan: The risks/benefits/side-effects/alternatives to the medications in use were discussed in detail with the patient and time was given for patient's questions. The patient consents to medication trial.    Medications: FORCED MED ORDER IN PLACE: -Continue Zyprexa 20 mg nightly and 10 mg in the mornings (Repeat EKG on 9/28 due to upward titrations of antipsychotics-Qtc [id number]) MUST GIVE IM zyprexa 5 mg each time IF PATIENT REFUSES PO -Tapered off Ativan:Reduced to Ativan 0.25 mg 3 doses then stop (ended 9/27). -Continue hydroxyzine 25 mg po tid prn for anxiety. -Start Ensure Nutritional Shakes TID for nutritional supplementation  Agitation protocols:  -Continue Benadryl 50 mg  po or IM tid prn. -Continue agitation protocol: tid prn Zyprexa PO or IM 5 mg TID -Previously discontinued Ativan on the agitation Protocol  Labs reviewed: UA with large leukocytes, rare bacteria, but pt is asymptomatic.  Other PRNS -Continue Tylenol 650 mg every 6 hours PRN for mild pain -Continue Maalox 30 ml Q 4 hrs PRN for indigestion -Continue MOM 30 ml po Q 6 hrs for constipation   Safety and Monitoring: Voluntary admission to inpatient psychiatric unit for safety, stabilization and treatment Daily contact with patient to assess and evaluate symptoms and progress in treatment Patient's case to be discussed in multi-disciplinary team meeting Observation Level : q15 minute checks Vital signs: q12 hours Precautions: Safety   Discharge Planning: Social work and case management to assist with discharge planning and identification of hospital follow-up needs prior to discharge Estimated LOS: 5-7 days Discharge Concerns: Need to establish a safety plan; Medication compliance and effectiveness Discharge Goals: Return home with outpatient referrals for mental health follow-up including medication management/psychotherapy   Observation Level/Precautions:  15 minute checks  Laboratory:   Will obtain a Lipid panel, hgba1c, lipid panel, TSH, U/A, UDS & a repeat cmp  Psychotherapy: Enrolled in the group sessions.   Medications:  See [facility]  Consultations: As needed.   Discharge Concerns: Safety, mood stability.   Estimated LOS: 7 days.  Other: NA.      Physician Treatment Plan for Primary Diagnosis: Major depressive disorder, single episode, severe with psychotic features (HCC) Long Term Goal(s): Improvement in symptoms so as ready for discharge   Short Term Goals: Ability to identify changes in lifestyle to reduce recurrence of condition will improve, Ability to verbalize feelings will improve, Ability to disclose and discuss suicidal ideas, and Ability to demonstrate self-control will  improve   Physician Treatment Plan for Secondary Diagnosis: Principal Problem:   Major depressive disorder, single  episode, severe with psychotic features (HCC) Active Problems:   MDD (major depressive disorder)   Long Term Goal(s): Improvement in symptoms so as ready for discharge   Short Term Goals: Ability to identify and develop effective coping behaviors will improve, Ability to maintain clinical measurements within normal limits will improve, Compliance with prescribed medications will improve, and Ability to identify triggers associated with substance abuse/mental health issues will improve   I certify that inpatient services furnished can reasonably be expected to improve the patient's condition.    Starleen Blue, NP 06/15/2023, 1:19 PM Patient ID: Angel [name], female   DOB: 06-24-1991, 32 y.o.   MRN: [id number]

## 2023-06-15 NOTE — Plan of Care (Signed)
  Problem: Activity: Goal: Sleeping patterns will improve Outcome: Progressing   

## 2023-06-15 NOTE — Plan of Care (Signed)
  Problem: Coping: Goal: Ability to verbalize frustrations and anger appropriately will improve Outcome: Progressing Goal: Ability to demonstrate self-control will improve Outcome: Progressing   

## 2023-06-16 DIAGNOSIS — F323 Major depressive disorder, single episode, severe with psychotic features: Secondary | ICD-10-CM | POA: Diagnosis not present
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
## 2023-06-16 NOTE — Group Note (Signed)
LCSW Group Therapy Note  Group Date: 06/16/2023 Start Time: 1300 End Time: 1400   Type of Therapy and Topic:  Group Therapy - How To Cope with Nervousness about Discharge   Participation Level:  Did Not Attend   Description of Group This process group involved identification of patients' feelings about discharge. Some of them are scheduled to be discharged soon, while others are new admissions, but each of them was asked to share thoughts and feelings surrounding discharge from the hospital. One common theme was that they are excited at the prospect of going home, while another was that many of them are apprehensive about sharing why they were hospitalized. Patients were given the opportunity to discuss these feelings with their peers in preparation for discharge.  Therapeutic Goals  Patient will identify their overall feelings about pending discharge. Patient will think about how they might proactively address issues that they believe will once again arise once they get home (i.e. with parents). Patients will participate in discussion about having hope for change.   Summary of Patient Progress:  Did Not Attend    Therapeutic Modalities Cognitive Behavioral Therapy   Beather Arbour 06/16/2023  2:17 PM

## 2023-06-16 NOTE — Plan of Care (Signed)
  Problem: Activity: Goal: Interest or engagement in activities will improve Outcome: Progressing Goal: Sleeping patterns will improve Outcome: Progressing   Problem: Health Behavior/Discharge Planning: Goal: Compliance with treatment plan for underlying cause of condition will improve Outcome: Progressing Pt is alert and oriented x 4. Denies SI, HI, and AVH. Complains of pain in lower back 5/10 and was given PRN Tylenol 650 mg and a heating pad as well as Mylanta 30 mL for gas at 0840 with desired effect when reassessed at 0940. Reports she slept well last night but wakes up due to increased hunger. Pt is a vegetarian and has received education on cafeteria choices and was given additional snacks. Visible in scheduled groups with participation in activities. Remains medication compliant and denies adverse drug reactions. Pt is preoccupied with speaking to the doctor to go home to her child. Continues to require frequent verbal redirection. Safety checks every 15 minutes.

## 2023-06-16 NOTE — B[facility] Group Notes (Signed)
Adult Psychoeducational Group Note  Date:  06/16/2023 Time:  2:25 PM  Group Topic/Focus:  Goals Group:   The focus of this group is to help patients establish daily goals to achieve during treatment and discuss how the patient can incorporate goal setting into their daily lives to aide in recovery. Orientation:   The focus of this group is to educate the patient on the purpose and policies of crisis stabilization and provide a format to answer questions about their admission.  The group details unit policies and expectations of patients while admitted.  Participation Level:  Did Not Attend  Participation Quality:    Affect:    Cognitive:    Insight:   Engagement in Group:    Modes of Intervention:    Additional Comments:    Sheran Lawless 06/16/2023, 2:25 PM

## 2023-06-16 NOTE — Group Note (Signed)
Recreation Therapy Group Note   Group Topic:Coping Skills  Group Date: 06/16/2023 Start Time: 1020 End Time: 1050 Facilitators: Jordyn Hofacker-McCall, LRT,CTRS Location: [address] Dayroom   Group Topic: Coping Skills   Goal Area(s) Addresses:  Patient will successfully define what a coping skill is. Patient will acknowledge current strategies used in terms of healthy vs unhealthy. Patient will successfully identify benefit of using outlined coping skills post d/c.   Group Description: Mind Map. LRT and patients discussed coping skills and their importance. Patients then given a blank mind map. LRT and patients filled in the first 8 boxes together (depression, anxiety, tolerance, hopelessness, stress, poor nutrition, anger and embarrassment). Patients were to then identify at least 3 coping skills for each situation identified. LRT gave patients 15 minutes before reconvening the group to fill in their answers on the board.    Education: Pharmacologist, Scientist, physiological, Discharge Planning.    Education Outcome: Acknowledges education/In group clarification offered/Needs additional education.    Affect/Mood: Appropriate   Participation Level: Engaged   Participation Quality: Independent   Behavior: Appropriate   Speech/Thought Process: Focused   Insight: Good   Judgement: Good   Modes of Intervention: Worksheet   Patient Response to Interventions:  Engaged   Education Outcome:  In group clarification offered    Clinical Observations/Individualized Feedback: Pt was more focused and engaged. Pt was appropriate and on task. Pt identified some coping skills such as walking, talk to people, eating hot food, take a break, set boundaries, leave intolerant people for awhile, family, rethink things, don't judge anyone, meditation, yoga, dancing, listen to music, call mom, cook healthy, eat fresh/organic food, have a garden and pray.    Plan: Continue to engage patient in RT group  sessions 2-3x/week.   [place] Frieden-McCall, LRT,CTRS 06/16/2023 1:05 PM

## 2023-06-16 NOTE — Group Note (Signed)
Date:  06/16/2023 Time:  9:40 PM  Group Topic/Focus:  Recovery Goals:   The focus of this group is to identify appropriate goals for recovery and establish a plan to achieve them.    Participation Level:  Did Not Attend  Participation Quality:    Affect:    Cognitive:    Insight:   Engagement in Group:    Modes of Intervention:    Additional Comments:    Angel [name] 06/16/2023, 9:40 PM

## 2023-06-16 NOTE — Progress Notes (Signed)
   06/16/23 2325  Psych Admission Type (Psych Patients Only)  Admission Status Voluntary  Psychosocial Assessment  Patient Complaints Anxiety  Eye Contact Fair  Facial Expression Anxious  Affect Anxious  Speech Logical/coherent  Interaction Assertive  Motor Activity Restless  Appearance/Hygiene Unremarkable  Behavior Characteristics Cooperative  Mood Anxious  Thought Process  Coherency Circumstantial  Content Preoccupation  Delusions None reported or observed  Perception WDL  Hallucination None reported or observed  Judgment Impaired  Confusion None  Danger to Self  Current suicidal ideation? Denies  Agreement Not to Harm Self Yes  Description of Agreement verbal contract for safety  Danger to Others  Danger to Others None reported or observed   D: Patient in dayroom reports she had a good day and is able to engage therapeutically. A: Medications administered as prescribed. Support and encouragement provided as needed.  R: Patient remains safe on the unit.

## 2023-06-16 NOTE — [facility] Group Notes (Signed)
Pt did not attend wrap-up group   

## 2023-06-17 DIAGNOSIS — F323 Major depressive disorder, single episode, severe with psychotic features: Secondary | ICD-10-CM | POA: Diagnosis not present

## 2023-06-17 MED ORDER — HALOPERIDOL 5 MG PO TABS
5.0000 mg | ORAL_TABLET | Freq: Three times a day (TID) | ORAL | Status: DC | PRN
  Administered 2023-06-19: 5 mg via ORAL
  Filled 2023-06-17: qty 1

## 2023-06-17 MED ORDER — HALOPERIDOL LACTATE 5 MG/ML IJ SOLN
5.0000 mg | Freq: Three times a day (TID) | INTRAMUSCULAR | Status: DC | PRN
  Administered 2023-06-17: 5 mg via INTRAMUSCULAR

## 2023-06-17 MED ORDER — HALOPERIDOL LACTATE 5 MG/ML IJ SOLN
INTRAMUSCULAR | Status: AC
  Filled 2023-06-17: qty 1

## 2023-06-17 MED ORDER — DIPHENHYDRAMINE HCL 25 MG PO CAPS
50.0000 mg | ORAL_CAPSULE | Freq: Three times a day (TID) | ORAL | Status: DC | PRN
Start: 2023-06-17 — End: 2023-06-17

## 2023-06-17 MED ORDER — LORAZEPAM 1 MG PO TABS
2.0000 mg | ORAL_TABLET | Freq: Three times a day (TID) | ORAL | Status: DC | PRN
Start: 2023-06-17 — End: 2023-06-17

## 2023-06-17 MED ORDER — DIPHENHYDRAMINE HCL 50 MG/ML IJ SOLN
INTRAMUSCULAR | Status: AC
  Filled 2023-06-17: qty 1

## 2023-06-17 MED ORDER — DIPHENHYDRAMINE HCL 50 MG/ML IJ SOLN
50.0000 mg | Freq: Three times a day (TID) | INTRAMUSCULAR | Status: DC | PRN
  Administered 2023-06-17: 50 mg via INTRAMUSCULAR

## 2023-06-17 MED ORDER — LORAZEPAM 1 MG PO TABS
2.0000 mg | ORAL_TABLET | Freq: Three times a day (TID) | ORAL | Status: DC | PRN
  Administered 2023-06-19: 2 mg via ORAL
  Filled 2023-06-17: qty 2

## 2023-06-17 MED ORDER — HALOPERIDOL 5 MG PO TABS
5.0000 mg | ORAL_TABLET | Freq: Three times a day (TID) | ORAL | Status: DC | PRN
Start: 2023-06-17 — End: 2023-06-17

## 2023-06-17 MED ORDER — LORAZEPAM 2 MG/ML IJ SOLN
INTRAMUSCULAR | Status: AC
  Filled 2023-06-17: qty 1

## 2023-06-17 MED ORDER — DIPHENHYDRAMINE HCL 25 MG PO CAPS
50.0000 mg | ORAL_CAPSULE | Freq: Three times a day (TID) | ORAL | Status: DC | PRN
  Administered 2023-06-19: 50 mg via ORAL
  Filled 2023-06-17: qty 2

## 2023-06-17 MED ORDER — LORAZEPAM 2 MG/ML IJ SOLN
2.0000 mg | Freq: Three times a day (TID) | INTRAMUSCULAR | Status: DC | PRN
  Administered 2023-06-17: 2 mg via INTRAMUSCULAR

## 2023-06-17 NOTE — Group Note (Signed)
Recreation Therapy Group Note   Group Topic:Relaxation  Group Date: 06/17/2023 Start Time: 1040 End Time: 1110 Facilitators: Tameria Patti-McCall, LRT,CTRS Location: [address] Dayroom   Group Topic: Stress Management  Goal Area(s) Addresses:  Patient will identify positive benefits of music. Patient will identify benefits of using music as a means to relax.  Group Description: LRT and patients discussed the importance of music and how it can affect your mood. LRT allowed patients to pick songs that interested them. As long as the songs were clean and appropriate, LRT played the songs for patients.    Education:  Stress Management, Discharge Planning.   Education Outcome: Acknowledges Education   Affect/Mood: Appropriate   Participation Level: Did not attend    Clinical Observations/Individualized Feedback:     Plan: Continue to engage patient in RT group sessions 2-3x/week.   Cariana Karge-McCall, LRT,CTRS 06/17/2023 12:30 PM

## 2023-06-17 NOTE — Progress Notes (Signed)
Pt HS medication held this evening due to pt being too lethargic from agitation protocol earlier    06/17/23 2200  Psych Admission Type (Psych Patients Only)  Admission Status Voluntary  Psychosocial Assessment  Patient Complaints Anxiety  Eye Contact Fair  Facial Expression Anxious  Affect Anxious  Speech Logical/coherent  Interaction Assertive  Motor Activity Restless  Appearance/Hygiene Unremarkable  Behavior Characteristics Cooperative  Mood Anxious  Aggressive Behavior  Effect No apparent injury  Thought Process  Coherency Circumstantial  Content Preoccupation  Delusions None reported or observed  Perception WDL  Hallucination None reported or observed  Judgment Impaired  Confusion None  Danger to Self  Current suicidal ideation? Denies

## 2023-06-17 NOTE — [facility] Group Notes (Signed)
Pt did not attend wrap-up group   

## 2023-06-17 NOTE — Plan of Care (Signed)
  Problem: Safety: Goal: Periods of time without injury will increase Outcome: Progressing   Problem: Coping: Goal: Ability to identify and develop effective coping behavior will improve Outcome: Progressing

## 2023-06-17 NOTE — Plan of Care (Signed)
  Problem: Activity: Goal: Sleeping patterns will improve Outcome: Progressing   Problem: Physical Regulation: Goal: Ability to maintain clinical measurements within normal limits will improve Outcome: Progressing   Problem: Safety: Goal: Periods of time without injury will increase Outcome: Progressing   

## 2023-06-17 NOTE — Progress Notes (Signed)
[facility] MD Progress Note  06/17/2023 4:07 PM Armanii Urbanik  MRN:  [id number]  Principal Problem: Major depressive disorder, single episode, severe with psychotic features (HCC) Diagnosis: Principal Problem:   Major depressive disorder, single episode, severe with psychotic features (HCC) Active Problems:   MDD (major depressive disorder)   Insomnia  Reason for admission: Jiah Goren is a 32 yo [place] female with prior mental health diagnoses of MDD & GAD who initially presented to the [facility] ER on 9/21 with complaints of chest pain. Pt presented to the ER with her toddler & DSS was contacted & child was later picked up by a friend of estranged husband due to 13 B order in place. Psychiatry was consulted, and pt was deemed to lack good decision making capacity sufficient enough to take care of herself or of her child.  She was recommended for inpatient behavioral health admission for treatment and stabilization of her mental status.  Patient was transferred to this [facility] on 9/22.  24 hr chart review: Vital signs within normal limits, with the exception of HR which was slightly elevated earlier today morning.  Required Ativan 2 mg last night for agitation, also required agitation protocol medications earlier today.  Disruptive earlier today morning and intrusive in the milieu.  Patient assessment note:  During encounter today, patient is disorganized, she is difficult to redirect, she follows writer around, is hyperfocused on getting her child back, focused on IVC that was given to her when she first presented to the unit.  She is hyperverbal today, states that she will not take any more medications, she is tangential, is illogical, lacks insight into the fact that she needs to continue taking her medications with a goal of continuous betterment in her mental health.  She continuously states "I do not have bipolar disease ma'am".  She repeats this multiple times, states that her mental health  is good.  States "I do not need any more support".  States that it is her life and not mine.  She presents with flight of ideas, as she rambles about multiple topics.  She is disruptive to the milieu, and due to concerns for her safety and that of other patients on the unit, she was given agitation protocol medications.  She however reports that she was able to sleep good last night, continues to perseverate about wanting to eat some [place] food, denies SI/HI/AVH.    Presents today with delusions of persecution, states that the staff on the unit are out to hurt her.  Yesterday, patient was making some progress, and was beginning to be logical, but is taking a turn for the worse today, and has regressed, and thereby needs the addition of a second antipsychotic medication, to treat and stabilize mental status prior to discharge.  Patient will need to be hospitalized for a few more days while this goal is achieved.  Current mental status renders patient at a risk of danger to self and others, and we will add Haldol 5 mg twice daily to current medication regimen for treatment and stabilization of her mental status.  Considerations were made for a mood stabilizer, but due to the fact that she is of childbearing age currently, we are holding of starting her on any mood stabilizers at this time.  We will reconsider this if second antipsychotic medication is ineffective. No TD/EPS type symptoms found on assessment, and pt denies any feelings of stiffness. AIMS: 0.   Total Time spent with patient: 45 minutes  Past Psychiatric History: See H & P  Past Medical History:  Past Medical History:  Diagnosis Date   Kidney stones    PCOS (polycystic ovarian syndrome)     Past Surgical History:  Procedure Laterality Date   APPENDECTOMY  2012   in [place]   CESAREAN SECTION N/A 03/13/2022   Procedure: CESAREAN SECTION;  Surgeon: Milas Hock, MD;  Location: [facility] LD ORS;  Service: Obstetrics;  Laterality: N/A;   Kidney  stone removal  2021   and 2014   RHINOPLASTY     Family History:  Family History  Problem Relation Age of Onset   Hypertension Mother    Kidney Stones Mother    Hypertension Father    Kidney Stones Father    Thyroid disease Sister    Seizures Sister    Polycystic ovary syndrome Sister    Family Psychiatric  History: See H & P Social History:  Social History   Substance and Sexual Activity  Alcohol Use No     Social History   Substance and Sexual Activity  Drug Use No    Social History   Socioeconomic History   Marital status: Legally Separated    Spouse name: pt declined   Number of children: 1   Years of education: Not on file   Highest education level: Patient refused  Occupational History   Not on file  Tobacco Use   Smoking status: Never   Smokeless tobacco: Never  Vaping Use   Vaping status: Never Used  Substance and Sexual Activity   Alcohol use: No   Drug use: No   Sexual activity: Not Currently    Birth control/protection: None  Other Topics Concern   Not on file  Social History Narrative   Not on file   Social Determinants of Health   Financial Resource Strain: Not on file  Food Insecurity: Food Insecurity Present (06/07/2023)   Hunger Vital Sign    Worried About Running Out of Food in the Last Year: Sometimes true    Ran Out of Food in the Last Year: Never true  Transportation Needs: No Transportation Needs (06/07/2023)   PRAPARE - Administrator, Civil Service (Medical): No    Lack of Transportation (Non-Medical): No  Physical Activity: Not on file  Stress: Not on file  Social Connections: Not on file   Sleep: Good  Appetite:  Fair  Current Medications: Current Facility-Administered Medications  Medication Dose Route Frequency Provider Last Rate Last Admin   acetaminophen (TYLENOL) tablet 650 mg  650 mg Oral Q6H PRN Massengill, Harrold Donath, MD   650 mg at 06/16/23 0841   alum & mag hydroxide-simeth (MAALOX/MYLANTA) 200-200-20  MG/5ML suspension 30 mL  30 mL Oral Q4H PRN Massengill, Harrold Donath, MD   30 mL at 06/16/23 0843   diphenhydrAMINE (BENADRYL) 50 MG/ML injection            haloperidol (HALDOL) tablet 5 mg  5 mg Oral TID PRN Phineas Inches, MD       And   LORazepam (ATIVAN) tablet 2 mg  2 mg Oral TID PRN Phineas Inches, MD       And   diphenhydrAMINE (BENADRYL) capsule 50 mg  50 mg Oral TID PRN Massengill, Harrold Donath, MD       haloperidol lactate (HALDOL) injection 5 mg  5 mg Intramuscular TID PRN Massengill, Harrold Donath, MD   5 mg at 06/17/23 1101   And   LORazepam (ATIVAN) injection 2 mg  2 mg Intramuscular  TID PRN Phineas Inches, MD   2 mg at 06/17/23 1100   And   diphenhydrAMINE (BENADRYL) injection 50 mg  50 mg Intramuscular TID PRN Phineas Inches, MD   50 mg at 06/17/23 1107   feeding supplement (ENSURE ENLIVE / ENSURE PLUS) liquid 237 mL  237 mL Oral TID WC PRN Massengill, Harrold Donath, MD   237 mL at 06/15/23 1412   haloperidol lactate (HALDOL) 5 MG/ML injection            hydrOXYzine (ATARAX) tablet 25 mg  25 mg Oral Q4H PRN Armandina Stammer I, NP   25 mg at 06/14/23 2035   LORazepam (ATIVAN) 2 MG/ML injection            magnesium hydroxide (MILK OF MAGNESIA) suspension 30 mL  30 mL Oral Daily PRN Massengill, Harrold Donath, MD       menthol-cetylpyridinium (CEPACOL) lozenge 3 mg  1 lozenge Oral PRN Starleen Blue, NP       OLANZapine zydis (ZYPREXA) disintegrating tablet 10 mg  10 mg Oral Daily Massengill, Nathan, MD   10 mg at 06/17/23 0825   OLANZapine zydis (ZYPREXA) disintegrating tablet 20 mg  20 mg Oral QHS Massengill, Harrold Donath, MD   20 mg at 06/16/23 2113   ondansetron (ZOFRAN-ODT) disintegrating tablet 4 mg  4 mg Oral Q8H PRN Sindy Guadeloupe, NP   4 mg at 06/12/23 2314   Lab Results:  No results found for this or any previous visit (from the past 48 hour(s)).  Blood Alcohol level:  No results found for: "ETH"  Metabolic Disorder Labs: Lab Results  Component Value Date   HGBA1C 4.8 06/09/2023   MPG 91.06  06/09/2023   No results found for: "PROLACTIN" Lab Results  Component Value Date   CHOL 147 06/09/2023   TRIG 57 06/09/2023   HDL 47 06/09/2023   CHOLHDL 3.1 06/09/2023   VLDL 11 06/09/2023   LDLCALC 89 06/09/2023   LDLCALC 43 08/14/2021   Physical Findings: AIMS:0 CIWA: n/a COWS:  n/a  Musculoskeletal: Strength & Muscle Tone: within normal limits Gait & Station: normal Patient leans: N/A  Psychiatric Specialty Exam:  Presentation  General Appearance:  Casual  Eye Contact: Good  Speech: Pressured  Speech Volume: Increased  Handedness: Right  Mood and Affect  Mood: Anxious  Affect: Congruent   Thought Process  Thought Processes: Disorganized  Descriptions of Associations:Tangential  Orientation:Partial  Thought Content:Illogical; Tangential  History of Schizophrenia/Schizoaffective disorder:No  Duration of Psychotic Symptoms:Less than six months  Hallucinations:Hallucinations: None  Ideas of Reference:Paranoia  Suicidal Thoughts:Suicidal Thoughts: No  Homicidal Thoughts:Homicidal Thoughts: No  Sensorium  Memory: Recent Poor; Immediate Poor  Judgment: Poor  Insight: Poor  Executive Functions  Concentration: Poor  Attention Span: Poor  Recall: Poor  Fund of Knowledge: Poor  Language: Fair  Psychomotor Activity  Psychomotor Activity: Psychomotor Activity: Normal  Assets  Assets: Resilience  Sleep  Sleep: Sleep: Fair  Physical Exam: Physical Exam Constitutional:      Appearance: Normal appearance.  Eyes:     Pupils: Pupils are equal, round, and reactive to light.  Musculoskeletal:        General: Normal range of motion.     Cervical back: Normal range of motion.  Neurological:     General: No focal deficit present.     Mental Status: She is alert and oriented to person, place, and time.    Review of Systems  Constitutional: Negative.   HENT: Negative.    Eyes: Negative.  Respiratory: Negative.     Cardiovascular: Negative.   Gastrointestinal: Negative.   Genitourinary: Negative.   Musculoskeletal: Negative.   Skin: Negative.   Neurological: Negative.   Psychiatric/Behavioral:  Positive for depression. Negative for hallucinations, memory loss, substance abuse and suicidal ideas. The patient is nervous/anxious and has insomnia.    Blood pressure 97/63, pulse (!) 104, temperature 98.6 F (37 C), temperature source Oral, resp. rate 16, height 5\' 2"  (1.575 m), weight 41.7 kg, last menstrual period 06/04/2023, SpO2 100%, not currently breastfeeding. Body mass index is 16.83 kg/m.  Treatment Plan Summary: Daily contact with patient to assess and evaluate symptoms and progress in treatment and Medication management.    Principal/active diagnoses.  Major depressive disorder, single episode, severe with psychotic features.  R/o Bipolar 1 disorder, manic episode.   Plan: The risks/benefits/side-effects/alternatives to the medications in use were discussed in detail with the patient and time was given for patient's questions. The patient consents to medication trial.    Medications:  -Start Haldol 5 mg twice daily for mood stabilization -Continue Zyprexa 20 mg nightly and 10 mg in the mornings (Repeat EKG on 9/28 due to upward titrations of antipsychotics-Qtc [id number])  -Tapered off Ativan:Reduced to Ativan 0.25 mg 3 doses then stop (ended 9/27). -Continue hydroxyzine 25 mg po tid prn for anxiety. -Continue Ensure Nutritional Shakes TID for nutritional supplementation  Agitation protocols:  -Continue Benadryl 50 mg po or IM tid prn. -Continue agitation protocol: tid prn Zyprexa PO or IM 5 mg TID -Previously discontinued Ativan on the agitation Protocol  Labs reviewed: UA with large leukocytes, rare bacteria, but pt is asymptomatic. Repeat UA ordered & pending.  Other PRNS -Continue Tylenol 650 mg every 6 hours PRN for mild pain -Continue Maalox 30 ml Q 4 hrs PRN for  indigestion -Continue MOM 30 ml po Q 6 hrs for constipation   Safety and Monitoring: Voluntary admission to inpatient psychiatric unit for safety, stabilization and treatment Daily contact with patient to assess and evaluate symptoms and progress in treatment Patient's case to be discussed in multi-disciplinary team meeting Observation Level : q15 minute checks Vital signs: q12 hours Precautions: Safety   Discharge Planning: Social work and case management to assist with discharge planning and identification of hospital follow-up needs prior to discharge Estimated LOS: 5-7 days Discharge Concerns: Need to establish a safety plan; Medication compliance and effectiveness Discharge Goals: Return home with outpatient referrals for mental health follow-up including medication management/psychotherapy   Observation Level/Precautions:  15 minute checks  Laboratory:   Will obtain a Lipid panel, hgba1c, lipid panel, TSH, U/A, UDS & a repeat cmp  Psychotherapy: Enrolled in the group sessions.   Medications:  See [facility]  Consultations: As needed.   Discharge Concerns: Safety, mood stability.   Estimated LOS: 7 days.  Other: NA.      Physician Treatment Plan for Primary Diagnosis: Major depressive disorder, single episode, severe with psychotic features (HCC) Long Term Goal(s): Improvement in symptoms so as ready for discharge   Short Term Goals: Ability to identify changes in lifestyle to reduce recurrence of condition will improve, Ability to verbalize feelings will improve, Ability to disclose and discuss suicidal ideas, and Ability to demonstrate self-control will improve   Physician Treatment Plan for Secondary Diagnosis: Principal Problem:   Major depressive disorder, single episode, severe with psychotic features (HCC) Active Problems:   MDD (major depressive disorder)   Long Term Goal(s): Improvement in symptoms so as ready for discharge   Short Term Goals:  Ability to identify and  develop effective coping behaviors will improve, Ability to maintain clinical measurements within normal limits will improve, Compliance with prescribed medications will improve, and Ability to identify triggers associated with substance abuse/mental health issues will improve   I certify that inpatient services furnished can reasonably be expected to improve the patient's condition.    Starleen Blue, NP 06/17/2023, 4:07 PM Patient ID: [name], female   DOB: 1991-02-16, 32 y.o.   MRN: [id number]

## 2023-06-17 NOTE — Progress Notes (Addendum)
[facility] LCSW Progress Note   06/17/2023    1:11 PM  Janyra Wires      Type of Note: Court Hearing   Pt letter was sent to the courts this morning for her IVC hearing. Patient shared that she wanted to attend her hearing today at 2:00pm . CSW will assist with this matter.    Patient attended her hearing today and the judge requested for another hearing trial this Friday @ 2:00pm. Sharee Pimple shared that she did not feel right about patient being here at [facility] under IVC and feels that the husband is taking advantage to keep the child and asked about outside collateral , which CSW has tried and is unable to dial out to [place]. However, the Sharee Pimple did ask for an interpreter to be called for pt so pt can have a better understanding about what is going and for the judge to grant what ever is needed for patient, after learning more about the story. CSW will rewrite patient court hearing letter.    Signed:   Jacob Moores, MSW, LCSWA 06/17/2023 1:11 PM

## 2023-06-17 NOTE — Progress Notes (Signed)
Note: Patient up and in the hallway in front of the nursing station asking staff and demanding to see her daughter.  Patient reminded of the process involved and was informed  Child psychotherapist will provide her with the information.  Patient became angry and agitated after meeting with her provider.  Patient stated, " I don't agree with my diagnosis of Bipolar, I'm not crazy, I just want to go home to my daughter."  Patient refused verbal redirection but started yelling loudly at staff, verbally abusive and threatened to stop taking all her medications.  Agitation protocol medications given IM with show of support.  Staff continues to offer support and encouragement as needed.  Routine safety checks maintained.  Patient is safe on the unit.

## 2023-06-17 NOTE — Plan of Care (Signed)
Problem: Education: Goal: Knowledge of [place] General Education information/materials will improve Outcome: Not Progressing Goal: Emotional status will improve Outcome: Not Progressing Goal: Mental status will improve Outcome: Not Progressing Goal: Verbalization of understanding the information provided will improve Outcome: Not Progressing   Problem: Activity: Goal: Interest or engagement in activities will improve Outcome: Not Progressing Goal: Sleeping patterns will improve Outcome: Not Progressing   Problem: Coping: Goal: Ability to verbalize frustrations and anger appropriately will improve Outcome: Not Progressing Goal: Ability to demonstrate self-control will improve Outcome: Not Progressing   Problem: Health Behavior/Discharge Planning: Goal: Identification of resources available to assist in meeting health care needs will improve Outcome: Not Progressing Goal: Compliance with treatment plan for underlying cause of condition will improve Outcome: Not Progressing   Problem: Physical Regulation: Goal: Ability to maintain clinical measurements within normal limits will improve Outcome: Not Progressing   Problem: Safety: Goal: Periods of time without injury will increase Outcome: Not Progressing   Problem: Education: Goal: Ability to state activities that reduce stress will improve Outcome: Not Progressing   Problem: Coping: Goal: Ability to identify and develop effective coping behavior will improve Outcome: Not Progressing   Problem: Self-Concept: Goal: Ability to identify factors that promote anxiety will improve Outcome: Not Progressing Goal: Level of anxiety will decrease Outcome: Not Progressing Goal: Ability to modify response to factors that promote anxiety will improve Outcome: Not Progressing   Problem: Education: Goal: Utilization of techniques to improve thought processes will improve Outcome: Not Progressing Goal: Knowledge of the  prescribed therapeutic regimen will improve Outcome: Not Progressing   Problem: Activity: Goal: Interest or engagement in leisure activities will improve Outcome: Not Progressing Goal: Imbalance in normal sleep/wake cycle will improve Outcome: Not Progressing   Problem: Coping: Goal: Coping ability will improve Outcome: Not Progressing Goal: Will verbalize feelings Outcome: Not Progressing   Problem: Health Behavior/Discharge Planning: Goal: Ability to make decisions will improve Outcome: Not Progressing Goal: Compliance with therapeutic regimen will improve Outcome: Not Progressing   Problem: Role Relationship: Goal: Will demonstrate positive changes in social behaviors and relationships Outcome: Not Progressing   Problem: Safety: Goal: Ability to disclose and discuss suicidal ideas will improve Outcome: Not Progressing Goal: Ability to identify and utilize support systems that promote safety will improve Outcome: Not Progressing   Problem: Self-Concept: Goal: Will verbalize positive feelings about self Outcome: Not Progressing Goal: Level of anxiety will decrease Outcome: Not Progressing   Problem: Education: Goal: Knowledge of General Education information will improve Description: Including pain rating scale, medication(s)/side effects and non-pharmacologic comfort measures Outcome: Not Progressing   Problem: Health Behavior/Discharge Planning: Goal: Ability to manage health-related needs will improve Outcome: Not Progressing   Problem: Clinical Measurements: Goal: Ability to maintain clinical measurements within normal limits will improve Outcome: Not Progressing Goal: Will remain free from infection Outcome: Not Progressing Goal: Diagnostic test results will improve Outcome: Not Progressing Goal: Respiratory complications will improve Outcome: Not Progressing Goal: Cardiovascular complication will be avoided Outcome: Not Progressing   Problem:  Activity: Goal: Risk for activity intolerance will decrease Outcome: Not Progressing   Problem: Nutrition: Goal: Adequate nutrition will be maintained Outcome: Not Progressing   Problem: Coping: Goal: Level of anxiety will decrease Outcome: Not Progressing   Problem: Elimination: Goal: Will not experience complications related to bowel motility Outcome: Not Progressing Goal: Will not experience complications related to urinary retention Outcome: Not Progressing   Problem: Pain Managment: Goal: General experience of comfort will improve Outcome: Not Progressing  Problem: Safety: Goal: Ability to remain free from injury will improve Outcome: Not Progressing   Problem: Skin Integrity: Goal: Risk for impaired skin integrity will decrease Outcome: Not Progressing

## 2023-06-18 ENCOUNTER — Encounter (HOSPITAL_COMMUNITY): Payer: Self-pay

## 2023-06-18 DIAGNOSIS — F323 Major depressive disorder, single episode, severe with psychotic features: Secondary | ICD-10-CM | POA: Diagnosis not present

## 2023-06-18 MED ORDER — SULFAMETHOXAZOLE-TRIMETHOPRIM 800-160 MG PO TABS
1.0000 | ORAL_TABLET | Freq: Two times a day (BID) | ORAL | Status: DC
  Administered 2023-06-18 – 2023-06-20 (×4): 1 via ORAL
  Filled 2023-06-18 (×9): qty 1

## 2023-06-18 NOTE — Progress Notes (Signed)
[facility] LCSW Progress Note   06/18/2023    9:38 AM  Vedanshi Salway      Type of Note: Visit with Patient    CSW went to see patient this morning to see what language interpretor to request for her hearing and patient asked CSW if the judge was told that she was a prostitute yesterday on hearing because she is not a prostitute and do not know why people are saying that. CSW told patient that no one told the judge that she was a prostitute and patient said , " ok ma'am but they speak Telugu ".     Signed:   Jacob Moores, MSW, [facility] 06/18/2023 9:38 AM

## 2023-06-18 NOTE — Group Note (Signed)
Recreation Therapy Group Note   Group Topic:Health and Wellness  Group Date: 06/18/2023 Start Time: 1000 End Time: 1025 Facilitators: Ronan Duecker-McCall, LRT,CTRS Location: 500 Hall Dayroom   Group Topic: Wellness  Goal Area(s) Addresses:  Patient will define components of whole wellness. Patient will verbalize benefit of whole wellness.  Group Description: Exercise. LRT explained to patients group would consist of completing at least 20 minutes of exercise. Patients would take turns leading the group in the exercises of their choosing. Patients were instructed to take breaks and/or get water as needed.   Education: Wellness, Building control surveyor.   Education Outcome: Acknowledges education/In group clarification offered/Needs additional education.    Affect/Mood: Appropriate   Participation Level: Active   Participation Quality: Independent   Behavior: Appropriate   Speech/Thought Process: Relevant   Insight: Moderate   Judgement: Moderate   Modes of Intervention: Music   Patient Response to Interventions:  Engaged   Education Outcome:  In group clarification offered    Clinical Observations/Individualized Feedback: Pt came in late to group. Pt was dancing and engaged with the music. Pt was smiling and interactive with staff and peers. Pt completed and led the group in some exercises in the last few minutes of group.     Plan: Continue to engage patient in RT group sessions 2-3x/week.   Altus Zaino-McCall, LRT,CTRS 06/18/2023 11:27 AM

## 2023-06-18 NOTE — Progress Notes (Signed)
[facility] LCSW Progress Note   06/18/2023    1:06 PM  Angel [name]      Type of Note: Interpreter services    CSW spent a 1 hour and a half on the phone trying to call patients parents in [place] . CSW spoke with 4 different interpreters, the first one was able to dial the number but it kept ringing and then the phone has disconnected, so CSW called back and each time while the Telugu interpretor was on the line, the operator who was dialing the number out , said that her there system was broken. CSW then asked for a in person interpreter and was told that the interpreting services does not have any in-person Telugu interpreters. CSW will continue to assist.     Signed:   Jacob Moores, MSW, [facility] 06/18/2023 1:06 PM

## 2023-06-18 NOTE — Progress Notes (Signed)
Patient pacing the halls and becoming increasingly more argumentative with staff. Intrusive with staff and peers. Stated, "You will not talk to me like you do with these other patients. I'm not like them. I'm a good human." Patient demanding to see a chiropractor. This RN explained to the patient that we do not offer those services and provided a heat pack. Patient yelling out of the day room window. Verbal de-escalation effective at this time. Safety checks continue. Patient remains safe at this time.

## 2023-06-18 NOTE — Progress Notes (Deleted)
Pt up at the nursing station stating she was anxious and having a hard time sleeping, staff talked to pt 1:1 and encouraged to relax and do some breathing exercises. Pt went back to her room and we will continue to monitor

## 2023-06-18 NOTE — Progress Notes (Signed)
Pt up at the nursing station stating she was anxious and having a hard time sleeping, staff talked to pt 1:1 and encouraged to relax and do some breathing exercises. Pt went back to her room and we will continue to monitor    06/18/23 2215  Psych Admission Type (Psych Patients Only)  Admission Status Voluntary  Psychosocial Assessment  Patient Complaints Anxiety;Nervousness  Eye Contact Fair  Facial Expression Anxious  Affect Anxious  Speech Logical/coherent  Interaction Assertive  Motor Activity Restless  Appearance/Hygiene Unremarkable  Behavior Characteristics Anxious  Mood Anxious;Suspicious  Aggressive Behavior  Effect No apparent injury  Thought Process  Coherency Circumstantial  Content Preoccupation  Delusions None reported or observed  Perception WDL  Hallucination None reported or observed  Judgment Impaired  Confusion None  Danger to Self  Current suicidal ideation? Denies

## 2023-06-18 NOTE — Plan of Care (Signed)
  Problem: Activity: Goal: Interest or engagement in activities will improve Outcome: Progressing   Problem: Coping: Goal: Ability to demonstrate self-control will improve Outcome: Progressing   Problem: Safety: Goal: Periods of time without injury will increase Outcome: Progressing   Problem: Education: Goal: Emotional status will improve Outcome: Not Progressing Goal: Mental status will improve Outcome: Not Progressing Goal: Verbalization of understanding the information provided will improve Outcome: Not Progressing   Problem: Activity: Goal: Sleeping patterns will improve Outcome: Not Progressing

## 2023-06-18 NOTE — BH IP Treatment Plan (Signed)
Interdisciplinary Treatment and Diagnostic Plan Update  06/18/2023 Time of Session: 230 Angel [name] MRN: [id number]  Principal Diagnosis: Major depressive disorder, single episode, severe with psychotic features (HCC)  Secondary Diagnoses: Principal Problem:   Major depressive disorder, single episode, severe with psychotic features (HCC) Active Problems:   MDD (major depressive disorder)   Insomnia   Current Medications:  Current Facility-Administered Medications  Medication Dose Route Frequency Provider Last Rate Last Admin   acetaminophen (TYLENOL) tablet 650 mg  650 mg Oral Q6H PRN Massengill, Harrold Donath, MD   650 mg at 06/16/23 0841   alum & mag hydroxide-simeth (MAALOX/MYLANTA) 200-200-20 MG/5ML suspension 30 mL  30 mL Oral Q4H PRN Massengill, Harrold Donath, MD   30 mL at 06/16/23 0843   haloperidol (HALDOL) tablet 5 mg  5 mg Oral TID PRN Phineas Inches, MD       And   LORazepam (ATIVAN) tablet 2 mg  2 mg Oral TID PRN Phineas Inches, MD       And   diphenhydrAMINE (BENADRYL) capsule 50 mg  50 mg Oral TID PRN Massengill, Harrold Donath, MD       haloperidol lactate (HALDOL) injection 5 mg  5 mg Intramuscular TID PRN Massengill, Harrold Donath, MD   5 mg at 06/17/23 1101   And   LORazepam (ATIVAN) injection 2 mg  2 mg Intramuscular TID PRN Phineas Inches, MD   2 mg at 06/17/23 1100   And   diphenhydrAMINE (BENADRYL) injection 50 mg  50 mg Intramuscular TID PRN Phineas Inches, MD   50 mg at 06/17/23 1107   feeding supplement (ENSURE ENLIVE / ENSURE PLUS) liquid 237 mL  237 mL Oral TID WC PRN Massengill, Harrold Donath, MD   237 mL at 06/15/23 1412   hydrOXYzine (ATARAX) tablet 25 mg  25 mg Oral Q4H PRN Armandina Stammer I, NP   25 mg at 06/14/23 2035   magnesium hydroxide (MILK OF MAGNESIA) suspension 30 mL  30 mL Oral Daily PRN Massengill, Harrold Donath, MD       menthol-cetylpyridinium (CEPACOL) lozenge 3 mg  1 lozenge Oral PRN Starleen Blue, NP       OLANZapine zydis (ZYPREXA) disintegrating tablet 10 mg   10 mg Oral Daily Massengill, Nathan, MD   10 mg at 06/18/23 0816   OLANZapine zydis (ZYPREXA) disintegrating tablet 20 mg  20 mg Oral QHS Massengill, Nathan, MD   20 mg at 06/18/23 0217   ondansetron (ZOFRAN-ODT) disintegrating tablet 4 mg  4 mg Oral Q8H PRN Sindy Guadeloupe, NP   4 mg at 06/12/23 2314   PTA Medications: No medications prior to admission.    Patient Stressors: Other: Patient currently feeling anxious and overwhelmed with caring for her daughter and herself    Patient Strengths: Ability for insight  Average or above average intelligence  Supportive family/friends   Treatment Modalities: Medication Management, Group therapy, Case management,  1 to 1 session with clinician, Psychoeducation, Recreational therapy.   Physician Treatment Plan for Primary Diagnosis: Major depressive disorder, single episode, severe with psychotic features (HCC) Long Term Goal(s): Improvement in symptoms so as ready for discharge   Short Term Goals: Ability to identify and develop effective coping behaviors will improve Ability to maintain clinical measurements within normal limits will improve Compliance with prescribed medications will improve Ability to identify triggers associated with substance abuse/mental health issues will improve Ability to identify changes in lifestyle to reduce recurrence of condition will improve Ability to verbalize feelings will improve Ability to disclose and discuss suicidal ideas Ability  to demonstrate self-control will improve  Medication Management: Evaluate patient's response, side effects, and tolerance of medication regimen.  Therapeutic Interventions: 1 to 1 sessions, Unit Group sessions and Medication administration.  Evaluation of Outcomes: Progressing  Physician Treatment Plan for Secondary Diagnosis: Principal Problem:   Major depressive disorder, single episode, severe with psychotic features (HCC) Active Problems:   MDD (major depressive  disorder)   Insomnia  Long Term Goal(s): Improvement in symptoms so as ready for discharge   Short Term Goals: Ability to identify and develop effective coping behaviors will improve Ability to maintain clinical measurements within normal limits will improve Compliance with prescribed medications will improve Ability to identify triggers associated with substance abuse/mental health issues will improve Ability to identify changes in lifestyle to reduce recurrence of condition will improve Ability to verbalize feelings will improve Ability to disclose and discuss suicidal ideas Ability to demonstrate self-control will improve     Medication Management: Evaluate patient's response, side effects, and tolerance of medication regimen.  Therapeutic Interventions: 1 to 1 sessions, Unit Group sessions and Medication administration.  Evaluation of Outcomes: Progressing   RN Treatment Plan for Primary Diagnosis: Major depressive disorder, single episode, severe with psychotic features (HCC) Long Term Goal(s): Knowledge of disease and therapeutic regimen to maintain health will improve  Short Term Goals: Ability to remain free from injury will improve, Ability to verbalize frustration and anger appropriately will improve, Ability to demonstrate self-control, Ability to participate in decision making will improve, Ability to verbalize feelings will improve, Ability to disclose and discuss suicidal ideas, Ability to identify and develop effective coping behaviors will improve, and Compliance with prescribed medications will improve  Medication Management: RN will administer medications as ordered by provider, will assess and evaluate patient's response and provide education to patient for prescribed medication. RN will report any adverse and/or side effects to prescribing provider.  Therapeutic Interventions: 1 on 1 counseling sessions, Psychoeducation, Medication administration, Evaluate responses to  treatment, Monitor vital signs and CBGs as ordered, Perform/monitor CIWA, COWS, AIMS and Fall Risk screenings as ordered, Perform wound care treatments as ordered.  Evaluation of Outcomes: Progressing   LCSW Treatment Plan for Primary Diagnosis: Major depressive disorder, single episode, severe with psychotic features (HCC) Long Term Goal(s): Safe transition to appropriate next level of care at discharge, Engage patient in therapeutic group addressing interpersonal concerns.  Short Term Goals: Engage patient in aftercare planning with referrals and resources, Increase social support, Increase ability to appropriately verbalize feelings, Increase emotional regulation, Facilitate acceptance of mental health diagnosis and concerns, Facilitate patient progression through stages of change regarding substance use diagnoses and concerns, Identify triggers associated with mental health/substance abuse issues, and Increase skills for wellness and recovery  Therapeutic Interventions: Assess for all discharge needs, 1 to 1 time with Social worker, Explore available resources and support systems, Assess for adequacy in community support network, Educate family and significant other(s) on suicide prevention, Complete Psychosocial Assessment, Interpersonal group therapy.  Evaluation of Outcomes: Progressing   Progress in Treatment: Attending groups: Yes. Participating in groups: Yes. Taking medication as prescribed: Yes. Toleration medication: Yes. Family/Significant other contact made: Yes, individual(s) contacted:  Father Patient understands diagnosis: No. Discussing patient identified problems/goals with staff: Yes. Medical problems stabilized or resolved: Yes. Denies suicidal/homicidal ideation: Yes. Issues/concerns per patient self-inventory: Yes. Other: N/A  New problem(s) identified: No, Describe:  None reported  New Short Term/Long Term Goal(s): stabilization, elimination of SI thoughts,  development of comprehensive mental wellness plan.  medication  Patient Goals:  Medication Stabilization  Discharge Plan or Barriers: . CSW will continue to follow and assess for appropriate referrals and possible discharge planning.     Reason for Continuation of Hospitalization: Delusions  Depression Hallucinations Medication stabilization  Estimated Length of Stay: 5-7 Days  Last 3 [place] Suicide Severity Risk Score: Flowsheet Row Ad[facility] (Current) [facility] Most recent reading at 06/07/2023 11:00 PM ED from 06/07/2023 in [facility] Department at [facility] Most recent reading at 06/07/2023  6:47 AM ED from 01/23/2023 in [facility] [place] Most recent reading at 01/23/2023  5:21 PM  C-SSRS RISK CATEGORY No Risk No Risk No Risk       Last PHQ 2/9 Scores:    02/20/2023   11:52 AM 03/05/2022    9:46 AM 09/20/2021    1:30 PM  Depression screen PHQ 2/9  Decreased Interest 0  2  Down, Depressed, Hopeless 1 0 1  PHQ - 2 Score 1 0 3  Altered sleeping 1 0 2  Tired, decreased energy 1 2 2   Change in appetite 1 1 3   Feeling bad or failure about yourself  1 3 2   Trouble concentrating 0 1 0  Moving slowly or fidgety/restless 1 1 1   Suicidal thoughts 0 0 0  PHQ-9 Score 6 8 13   detox, medication management for mood stabilization; elimination of SI thoughts; development of comprehensive mental wellness/sobriety plan  .    Scribe for Treatment Team: Ane Payment, LCSW 06/18/2023 3:09 PM

## 2023-06-18 NOTE — Progress Notes (Signed)
[facility] LCSW Progress Note   06/18/2023    2:43 PM  Angel [name]      Type of Note: Visit with Patient    CSW spoke with patient and informed her that their has not been any luck with reaching her mom or dad with the interpretor language line today.  Patient stated " since it is a 10 hour difference in time, you can call my neighbor; I do not want you bothering my parents because they are sleeping, my neighbor Simeon Craft , you can give her a call @ [phone]". CSW will reach out to patient neighbor and continue to assist.      Signed:   Jacob Moores, MSW, [facility] 06/18/2023 2:43 PM

## 2023-06-18 NOTE — Progress Notes (Signed)
Patient approached this RN asking for the time. When told that the time was 4:20pm, patient began yelling, "It's not 4:20, it's 4:21. Stop saying it's 4:20 when it's 4:21." Patient then stated, "I demand to see my doctor right now," and then became tangential talking about her neighbor and discharge. When patient was asked to return to her room, she stated, "I have back pain and you're not doing anything about it. I want to see an orthopedic doctor right now." Patient was offered tylenol but refused. "I do not need any medications. I just need to get out of here." Patient was offered tylenol again and patient agreed. Upon approaching the med window, "You only give me tylenol. I will not take any other medications. I don't need medications." Patient was advised that only tylenol would be administered but became upset and started yelling at staff again and leaving the medication window. Patient spend several minutes in the dayroom yelling at staff and then approached this RN asking for a heat pack. Heat pack supplied. Patient remains safe at this time.

## 2023-06-18 NOTE — Progress Notes (Signed)
Patient observed standing at the dayroom window yelling at the squirrels outside. Patient redirected. Safety checks continue. Patient remains safe at this time.

## 2023-06-18 NOTE — Progress Notes (Signed)
Pt woke up , pt began arguing with staff, pt informed she needed to take her medication , pt informed that if she did not take it by mouth she was going to have to get a shot , pt became defensive and belligerent when writer asked to check her mouth .

## 2023-06-18 NOTE — Plan of Care (Signed)
?  Problem: Education: Goal: Emotional status will improve Outcome: Not Progressing Goal: Mental status will improve Outcome: Not Progressing   Problem: Coping: Goal: Ability to demonstrate self-control will improve Outcome: Not Progressing   

## 2023-06-18 NOTE — [facility] Group Notes (Signed)
[facility] Group Notes:  (Nursing/MHT/Case Management/Adjunct)  Date:  06/18/2023  Time:  9:35 PM  Type of Therapy:   Wrap-up group  Participation Level:  Active  Participation Quality:  Appropriate  Affect:  Appropriate  Cognitive:  Appropriate  Insight:  Appropriate  Engagement in Group:  Engaged  Modes of Intervention:  Education  Summary of Progress/Problems: Pt goal to be D/C. Pt rated her dau 7/10.   Noah Delaine 06/18/2023, 9:35 PM

## 2023-06-18 NOTE — Progress Notes (Signed)
[facility] MD Progress Note  06/18/2023 4:33 PM [name]  MRN:  [id number]  Principal Problem: Major depressive disorder, single episode, severe with psychotic features (HCC) Diagnosis: Principal Problem:   Major depressive disorder, single episode, severe with psychotic features (HCC) Active Problems:   MDD (major depressive disorder)   Insomnia  Reason for admission: Angel [name] is a 32 yo [place] female with prior mental health diagnoses of MDD & GAD who initially presented to the [facility] ER on 9/21 with complaints of chest pain. Pt presented to the ER with her toddler & DSS was contacted & child was later picked up by a friend of estranged husband due to 47 B order in place. Psychiatry was consulted, and pt was deemed to lack good decision making capacity sufficient enough to take care of herself or of her child.  She was recommended for inpatient behavioral health admission for treatment and stabilization of her mental status.  Patient was transferred to this [facility] on 9/22.  24 hr chart review: Patient hypotensive earlier today morning, with blood pressure 85/74.  Fluids encouraged, nursing asked to recheck vital signs.  Belligerent overnight, woke up at 2 AM as per nursing documentation, was argumentative with staff, reported to be intrusive & demanding over the past 24 hrs.   Patient assessment note:  During encounter today, patient is demanding, she is intrusive, following Clinical research associate around, stating that she does not have "bipolar disease", demanding to be discharged, asking for an [place] doctor, not receptive to Clinical research associate explaining to her the rationale for continuous hospitalization.  Patient is without insight into her current mental status, which is disorganized, illogical, and renders her in need of continued hospitalization for treatment and stabilization.  She perseverates about multiple things, and presents with flight of ideas, as she talks about multiple topics including her child,  wanting "an Orthopediatrician", demands for writer to find her one, states that this type of provider "stretches the back".  She reports that she slept well last night, reports that her appetite is good, and that she is doing her best to eat here, but is still craving [place] food.  She reports urine hesitancy and frequency.  Reports pelvic pain which is intermittent, denies burning on urination.  Agreeable to being treated with an antibiotic for a possible urinary tract infection, since her urinalysis is significant for large leukocytes.  Patient denies SI/HI/AVH.  She is paranoid, suspicious, disorganized, irrational, illogical, and continuing to need hospitalization at this time to stabilize her mental status.  We will start Septra DS 1 tablet twice daily x 7 days for possible UTI.  Continuing other medications as listed below (10 mg of Zyprexa in the mornings and 20 mg of Zyprexa nightly for mood stabilization.)  We will continue to revisit discharge planning on a daily basis.  Writer spoke to patient's father, who called from [place], identified self as Angel [name].  Able to provide patient's correct date of birth.  Inquired reason for hospitalization, which writer explained to him, as well as discussed patient's diagnosis, medications, as well as rationales for medications, benefits of medications, as well as overall hospital course.  Writer also discussed with father where patient is at this time in terms of treatment, and mental status as described above.  Patient's father verbalized understanding of above, and denied that patient has any past mental health diagnoses, denied that patient has ever been hospitalized for mental health reasons, denied that patient has ever taken any psychotropic medications in the past.  He reported that patient had been stressed out lately related to her marriage failing, and raising a child by her self, as a single parent, which she thinks is contributing to patient's  symptoms.  Patient's father asked if patient had insurance, to Therapist, occupational responded that there is an Chief Financial Officer on chart, but Clinical research associate could not guarantee that the information is accurate, as Clinical research associate does not work in Herbalist.  Father inquired if patient will get a bill after she leaves the hospital, to which writer again stated being unsure.  Father inquired length of hospitalization, to which writer told father that patient has been hospitalized so far for 11 days, and also educated father that this is long for most of patients, but that attempts are still being made to stabilize her mental status sufficient enough for her to be able to function in the community.  Patient's father educated that current presentation renders patient as a risk of danger to self in the community, as her thoughts are still very disorganized at this time.  Writer inquired with father if patient was coherent when speaking with him, to which father responded that "you are the medical professionals, if you think that she needs to be there to get treatment, then that is okay with me".  Writer educated father of the start up of the unit, and father asked if [place] food could be brought to the unit, and Clinical research associate informed father that we would be inclusive for patient's native [place] dish once a day to be brought to her, if someone can bring it to the unit.  Father stated that he would get somebody to bring the food for patient on a daily basis.  Patient's assigned RN notified to allow food, and order will be placed.  Total Time spent with patient: 45 minutes  Past Psychiatric History: See H & P  Past Medical History:  Past Medical History:  Diagnosis Date   Kidney stones    PCOS (polycystic ovarian syndrome)     Past Surgical History:  Procedure Laterality Date   APPENDECTOMY  2012   in [place]   CESAREAN SECTION N/A 03/13/2022   Procedure: CESAREAN SECTION;  Surgeon: Milas Hock, MD;  Location: [facility] LD ORS;  Service:  Obstetrics;  Laterality: N/A;   Kidney stone removal  2021   and 2014   RHINOPLASTY     Family History:  Family History  Problem Relation Age of Onset   Hypertension Mother    Kidney Stones Mother    Hypertension Father    Kidney Stones Father    Thyroid disease Sister    Seizures Sister    Polycystic ovary syndrome Sister    Family Psychiatric  History: See H & P Social History:  Social History   Substance and Sexual Activity  Alcohol Use No     Social History   Substance and Sexual Activity  Drug Use No    Social History   Socioeconomic History   Marital status: Legally Separated    Spouse name: pt declined   Number of children: 1   Years of education: Not on file   Highest education level: Patient refused  Occupational History   Not on file  Tobacco Use   Smoking status: Never   Smokeless tobacco: Never  Vaping Use   Vaping status: Never Used  Substance and Sexual Activity   Alcohol use: No   Drug use: No   Sexual activity: Not Currently    Birth control/protection: None  Other Topics Concern   Not on file  Social History Narrative   Not on file   Social Determinants of Health   Financial Resource Strain: Not on file  Food Insecurity: Food Insecurity Present (06/07/2023)   Hunger Vital Sign    Worried About Running Out of Food in the Last Year: Sometimes true    Ran Out of Food in the Last Year: Never true  Transportation Needs: No Transportation Needs (06/07/2023)   PRAPARE - Administrator, Civil Service (Medical): No    Lack of Transportation (Non-Medical): No  Physical Activity: Not on file  Stress: Not on file  Social Connections: Not on file   Sleep: Good  Appetite:  Fair  Current Medications: Current Facility-Administered Medications  Medication Dose Route Frequency Provider Last Rate Last Admin   acetaminophen (TYLENOL) tablet 650 mg  650 mg Oral Q6H PRN Massengill, Harrold Donath, MD   650 mg at 06/16/23 0841   alum & mag  hydroxide-simeth (MAALOX/MYLANTA) 200-200-20 MG/5ML suspension 30 mL  30 mL Oral Q4H PRN Massengill, Harrold Donath, MD   30 mL at 06/16/23 0843   haloperidol (HALDOL) tablet 5 mg  5 mg Oral TID PRN Phineas Inches, MD       And   LORazepam (ATIVAN) tablet 2 mg  2 mg Oral TID PRN Phineas Inches, MD       And   diphenhydrAMINE (BENADRYL) capsule 50 mg  50 mg Oral TID PRN Massengill, Harrold Donath, MD       haloperidol lactate (HALDOL) injection 5 mg  5 mg Intramuscular TID PRN Massengill, Harrold Donath, MD   5 mg at 06/17/23 1101   And   LORazepam (ATIVAN) injection 2 mg  2 mg Intramuscular TID PRN Phineas Inches, MD   2 mg at 06/17/23 1100   And   diphenhydrAMINE (BENADRYL) injection 50 mg  50 mg Intramuscular TID PRN Phineas Inches, MD   50 mg at 06/17/23 1107   feeding supplement (ENSURE ENLIVE / ENSURE PLUS) liquid 237 mL  237 mL Oral TID WC PRN Massengill, Harrold Donath, MD   237 mL at 06/15/23 1412   hydrOXYzine (ATARAX) tablet 25 mg  25 mg Oral Q4H PRN Armandina Stammer I, NP   25 mg at 06/14/23 2035   magnesium hydroxide (MILK OF MAGNESIA) suspension 30 mL  30 mL Oral Daily PRN Massengill, Harrold Donath, MD       menthol-cetylpyridinium (CEPACOL) lozenge 3 mg  1 lozenge Oral PRN Starleen Blue, NP       OLANZapine zydis (ZYPREXA) disintegrating tablet 10 mg  10 mg Oral Daily Massengill, Nathan, MD   10 mg at 06/18/23 0816   OLANZapine zydis (ZYPREXA) disintegrating tablet 20 mg  20 mg Oral QHS Massengill, Harrold Donath, MD   20 mg at 06/18/23 0217   ondansetron (ZOFRAN-ODT) disintegrating tablet 4 mg  4 mg Oral Q8H PRN Sindy Guadeloupe, NP   4 mg at 06/12/23 2314   sulfamethoxazole-trimethoprim (BACTRIM DS) 800-160 MG per tablet 1 tablet  1 tablet Oral Q12H Dona Walby, NP       Lab Results:  No results found for this or any previous visit (from the past 48 hour(s)).  Blood Alcohol level:  No results found for: "ETH"  Metabolic Disorder Labs: Lab Results  Component Value Date   HGBA1C 4.8 06/09/2023   MPG 91.06  06/09/2023   No results found for: "PROLACTIN" Lab Results  Component Value Date   CHOL 147 06/09/2023   TRIG 57 06/09/2023   HDL  47 06/09/2023   CHOLHDL 3.1 06/09/2023   VLDL 11 06/09/2023   LDLCALC 89 06/09/2023   LDLCALC 43 08/14/2021   Physical Findings: AIMS:0 CIWA: n/a COWS:  n/a  Musculoskeletal: Strength & Muscle Tone: within normal limits Gait & Station: normal Patient leans: N/A  Psychiatric Specialty Exam:  Presentation  General Appearance:  Casual; Fairly Groomed  Eye Contact: Fair  Speech: Pressured  Speech Volume: Increased  Handedness: Right  Mood and Affect  Mood: Anxious; Irritable  Affect: Congruent   Thought Process  Thought Processes: Disorganized  Descriptions of Associations:Loose  Orientation:Partial  Thought Content:Illogical  History of Schizophrenia/Schizoaffective disorder:No  Duration of Psychotic Symptoms:Less than six months  Hallucinations:Hallucinations: None  Ideas of Reference:Paranoia  Suicidal Thoughts:Suicidal Thoughts: No  Homicidal Thoughts:Homicidal Thoughts: No  Sensorium  Memory: Recent Poor  Judgment: Poor  Insight: Poor  Executive Functions  Concentration: Poor  Attention Span: Poor  Recall: Poor; Fair  [employer] of Knowledge: Fair  Language: Fair  Lexicographer Activity: Psychomotor Activity: Normal  Assets  Assets: Resilience  Sleep  Sleep: Sleep: Fair  Physical Exam: Physical Exam Constitutional:      Appearance: Normal appearance.  Eyes:     Pupils: Pupils are equal, round, and reactive to light.  Musculoskeletal:        General: Normal range of motion.     Cervical back: Normal range of motion.  Neurological:     General: No focal deficit present.     Mental Status: She is alert and oriented to person, place, and time.    Review of Systems  Constitutional: Negative.   HENT: Negative.    Eyes: Negative.   Respiratory: Negative.     Cardiovascular: Negative.   Gastrointestinal: Negative.   Genitourinary: Negative.   Musculoskeletal: Negative.   Skin: Negative.   Neurological: Negative.   Psychiatric/Behavioral:  Positive for depression. Negative for hallucinations, memory loss, substance abuse and suicidal ideas. The patient is nervous/anxious and has insomnia.    Blood pressure 107/72, pulse 94, temperature 97.9 F (36.6 C), temperature source Oral, resp. rate 16, height 5\' 2"  (1.575 m), weight 41.7 kg, last menstrual period 06/04/2023, SpO2 100%, not currently breastfeeding. Body mass index is 16.83 kg/m.  Treatment Plan Summary: Daily contact with patient to assess and evaluate symptoms and progress in treatment and Medication management.    Principal/active diagnoses.  Major depressive disorder, single episode, severe with psychotic features.  R/o Bipolar 1 disorder, manic episode.   Plan: The risks/benefits/side-effects/alternatives to the medications in use were discussed in detail with the patient and time was given for patient's questions. The patient consents to medication trial.    Medications:  -Start Bactrim Ds-1 tablet BID for UTI -Continue Zyprexa 20 mg nightly and 10 mg in the mornings (Repeat EKG on 9/28 due to upward titrations of antipsychotics-Qtc [id number])  -Tapered off Ativan:Reduced to Ativan 0.25 mg 3 doses then stop (ended 9/27). -Continue hydroxyzine 25 mg po tid prn for anxiety. -Continue Ensure Nutritional Shakes TID for nutritional supplementation  Agitation protocols:  -Continue Benadryl 50 mg po or IM tid prn. -Continue agitation protocol: tid prn Zyprexa PO or IM 5 mg TID -Previously discontinued Ativan on the agitation Protocol  Labs reviewed: UA with large leukocytes, rare bacteria,  pt is symptomatic, complaining, of intermittent pelvic pain & frequency.   Other PRNS -Continue Tylenol 650 mg every 6 hours PRN for mild pain -Continue Maalox 30 ml Q 4 hrs PRN for  indigestion -Continue MOM 30 ml  po Q 6 hrs for constipation   Safety and Monitoring: Voluntary admission to inpatient psychiatric unit for safety, stabilization and treatment Daily contact with patient to assess and evaluate symptoms and progress in treatment Patient's case to be discussed in multi-disciplinary team meeting Observation Level : q15 minute checks Vital signs: q12 hours Precautions: Safety   Discharge Planning: Social work and case management to assist with discharge planning and identification of hospital follow-up needs prior to discharge Estimated LOS: 5-7 days Discharge Concerns: Need to establish a safety plan; Medication compliance and effectiveness Discharge Goals: Return home with outpatient referrals for mental health follow-up including medication management/psychotherapy   Observation Level/Precautions:  15 minute checks  Laboratory:   Will obtain a Lipid panel, hgba1c, lipid panel, TSH, U/A, UDS & a repeat cmp  Psychotherapy: Enrolled in the group sessions.   Medications:  See [facility]  Consultations: As needed.   Discharge Concerns: Safety, mood stability.   Estimated LOS: 7 days.  Other: NA.      Physician Treatment Plan for Primary Diagnosis: Major depressive disorder, single episode, severe with psychotic features (HCC) Long Term Goal(s): Improvement in symptoms so as ready for discharge   Short Term Goals: Ability to identify changes in lifestyle to reduce recurrence of condition will improve, Ability to verbalize feelings will improve, Ability to disclose and discuss suicidal ideas, and Ability to demonstrate self-control will improve   Physician Treatment Plan for Secondary Diagnosis: Principal Problem:   Major depressive disorder, single episode, severe with psychotic features (HCC) Active Problems:   MDD (major depressive disorder)   Long Term Goal(s): Improvement in symptoms so as ready for discharge   Short Term Goals: Ability to identify and  develop effective coping behaviors will improve, Ability to maintain clinical measurements within normal limits will improve, Compliance with prescribed medications will improve, and Ability to identify triggers associated with substance abuse/mental health issues will improve   I certify that inpatient services furnished can reasonably be expected to improve the patient's condition.    Starleen Blue, NP 06/18/2023, 4:33 PM Patient ID: [name], female   DOB: [birth year], 32 y.o.   MRN: [id number] Patient ID: Celestina Gironda, female   DOB: 03-19-1991, 32 y.o.   MRN: [id number]

## 2023-06-19 DIAGNOSIS — F323 Major depressive disorder, single episode, severe with psychotic features: Secondary | ICD-10-CM | POA: Diagnosis not present

## 2023-06-19 NOTE — Plan of Care (Signed)
  Problem: Education: Goal: Knowledge of [facility] Education information/materials will improve Outcome: Progressing Goal: Emotional status will improve Outcome: Progressing Goal: Mental status will improve Outcome: Progressing Goal: Verbalization of understanding the information provided will improve Outcome: Progressing   Problem: Activity: Goal: Interest or engagement in activities will improve Outcome: Progressing Goal: Sleeping patterns will improve Outcome: Progressing   Problem: Coping: Goal: Ability to verbalize frustrations and anger appropriately will improve Outcome: Progressing Goal: Ability to demonstrate self-control will improve Outcome: Progressing   Problem: Health Behavior/Discharge Planning: Goal: Identification of resources available to assist in meeting health care needs will improve Outcome: Progressing Goal: Compliance with treatment plan for underlying cause of condition will improve Outcome: Progressing   Problem: Physical Regulation: Goal: Ability to maintain clinical measurements within normal limits will improve Outcome: Progressing   Problem: Safety: Goal: Periods of time without injury will increase Outcome: Progressing   Problem: Education: Goal: Ability to state activities that reduce stress will improve Outcome: Progressing   Problem: Coping: Goal: Ability to identify and develop effective coping behavior will improve Outcome: Progressing   Problem: Self-Concept: Goal: Ability to identify factors that promote anxiety will improve Outcome: Progressing Goal: Level of anxiety will decrease Outcome: Progressing Goal: Ability to modify response to factors that promote anxiety will improve Outcome: Progressing   Problem: Education: Goal: Utilization of techniques to improve thought processes will improve Outcome: Progressing Goal: Knowledge of the prescribed therapeutic regimen will improve Outcome: Progressing   Problem:  Activity: Goal: Interest or engagement in leisure activities will improve Outcome: Progressing Goal: Imbalance in normal sleep/wake cycle will improve Outcome: Progressing   Problem: Coping: Goal: Coping ability will improve Outcome: Progressing Goal: Will verbalize feelings Outcome: Progressing   Problem: Health Behavior/Discharge Planning: Goal: Ability to make decisions will improve Outcome: Progressing Goal: Compliance with therapeutic regimen will improve Outcome: Progressing   Problem: Role Relationship: Goal: Will demonstrate positive changes in social behaviors and relationships Outcome: Progressing   Problem: Safety: Goal: Ability to disclose and discuss suicidal ideas will improve Outcome: Progressing Goal: Ability to identify and utilize support systems that promote safety will improve Outcome: Progressing   Problem: Self-Concept: Goal: Will verbalize positive feelings about self Outcome: Progressing Goal: Level of anxiety will decrease Outcome: Progressing   Problem: Education: Goal: Knowledge of General Education information will improve Description: Including pain rating scale, medication(s)/side effects and non-pharmacologic comfort measures Outcome: Progressing   Problem: Health Behavior/Discharge Planning: Goal: Ability to manage health-related needs will improve Outcome: Progressing   Problem: Clinical Measurements: Goal: Ability to maintain clinical measurements within normal limits will improve Outcome: Progressing Goal: Will remain free from infection Outcome: Progressing Goal: Diagnostic test results will improve Outcome: Progressing Goal: Respiratory complications will improve Outcome: Progressing Goal: Cardiovascular complication will be avoided Outcome: Progressing   Problem: Activity: Goal: Risk for activity intolerance will decrease Outcome: Progressing   Problem: Nutrition: Goal: Adequate nutrition will be maintained Outcome:  Progressing   Problem: Coping: Goal: Level of anxiety will decrease Outcome: Progressing   Problem: Elimination: Goal: Will not experience complications related to bowel motility Outcome: Progressing Goal: Will not experience complications related to urinary retention Outcome: Progressing   Problem: Pain Managment: Goal: General experience of comfort will improve Outcome: Progressing   Problem: Safety: Goal: Ability to remain free from injury will improve Outcome: Progressing   Problem: Skin Integrity: Goal: Risk for impaired skin integrity will decrease Outcome: Progressing

## 2023-06-19 NOTE — Plan of Care (Signed)
  Problem: Activity: Goal: Sleeping patterns will improve Outcome: Progressing   Problem: Safety: Goal: Periods of time without injury will increase Outcome: Progressing   Problem: Health Behavior/Discharge Planning: Goal: Compliance with therapeutic regimen will improve Outcome: Progressing   Problem: Education: Goal: Knowledge of General Education information will improve Description: Including pain rating scale, medication(s)/side effects and non-pharmacologic comfort measures Outcome: Progressing   Problem: Education: Goal: Emotional status will improve Outcome: Not Progressing Goal: Mental status will improve Outcome: Not Progressing   Problem: Role Relationship: Goal: Will demonstrate positive changes in social behaviors and relationships Outcome: Not Progressing   Problem: Self-Concept: Goal: Level of anxiety will decrease Outcome: Not Progressing

## 2023-06-19 NOTE — Progress Notes (Signed)
[facility] LCSW Progress Note   06/19/2023    1:25 PM  Angel [name]      Type of Note: Visit With Patient    CSW took patient back on unit and asked her if she was going back to [place] since he parents want her home and patient stated, " no I am going to stay here in [place]  to take care of myself and child until the 8th or 9th of October , I will need all the resources, but I will not abandon my child again"; "it was good talking to my parents , they give me strength ". CSW will continue to assist.     Signed:   Jacob Moores, MSW, LCSWA 06/19/2023 1:25 PM

## 2023-06-19 NOTE — Progress Notes (Signed)
   06/19/23 2100  Psych Admission Type (Psych Patients Only)  Admission Status Involuntary  Psychosocial Assessment  Patient Complaints Anxiety;Agitation  Eye Contact Fair  Facial Expression Anxious  Affect Anxious  Speech Logical/coherent  Interaction Assertive  Motor Activity Restless  Appearance/Hygiene Unremarkable  Behavior Characteristics Anxious;Agitated  Mood Labile;Suspicious  Aggressive Behavior  Effect No apparent injury  Thought Process  Coherency Circumstantial  Content Preoccupation  Delusions None reported or observed  Perception WDL  Hallucination None reported or observed  Judgment Impaired  Confusion None  Danger to Self  Current suicidal ideation? Denies
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
## 2023-06-19 NOTE — Group Note (Signed)
Recreation Therapy Group Note   Group Topic:Stress Management  Group Date: 06/19/2023 Start Time: 1011 End Time: 1031 Facilitators: Shahla Betsill-McCall, LRT,CTRS Location: [address] Hall Dayroom   Group Topic: Stress Management  Goal Area(s) Addresses:  Patient will identify positive stress management techniques. Patient will identify benefits of using stress management post d/c.  Group Description: Meditation. LRT and patients discussed the impact meditation can have on helping to relax into a calming mindset. LRT then played a meditation that focused on being patient. LRT then gave patients a worksheet that focused on finding peace as group ended to take with them  Education:  Stress Management, Discharge Planning.   Education Outcome: Acknowledges Education   Affect/Mood: N/A   Participation Level: Did not attend    Clinical Observations/Individualized Feedback:     Plan: Continue to engage patient in RT group sessions 2-3x/week.   Lashika Erker-McCall, LRT,CTRS  06/19/2023 12:18 PM
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
## 2023-06-19 NOTE — Progress Notes (Signed)
[facility] LCSW Progress Note   06/19/2023    3:03 PM  Angel [name]      Type of Note: [facility] and [facility] Shelters  CSW provided patient with shelter resources and patient was on the floor. CSW asked patient why she was on the floor and patient said because her back was hurting.  CSW explained the resources and patient said that she will call the places once she gets home. Patient then asked about being DC and CSW told her that after we have the second hearing tomorrow @ 2pm , then we will have a better idea of DC date from doctor. CSW will continue to assist.     Signed:   Jacob Moores, MSW, [facility] 06/19/2023 3:03 PM

## 2023-06-19 NOTE — Progress Notes (Signed)
[facility] LCSW Progress Note   06/19/2023    10:05 AM  Angel [name]      Type of Note: Attempt call the Neighbor  CSW reached out to patient neighbor yesterday and this morning and had to leave another voicemail. CSW will continue to assist.     Signed:   Jacob Moores, MSW, [facility] 06/19/2023 10:05 AM

## 2023-06-19 NOTE — Group Note (Signed)
Date:  06/19/2023 Time:  8:35 PM  Group Topic/Focus:  Wrap-Up Group:   The focus of this group is to help patients review their daily goal of treatment and discuss progress on daily workbooks.    Participation Level:  Did Not Attend  Participation Quality:   N/A  Affect:   N/A  Cognitive:   N/A  Insight: None  Engagement in Group:  None  Modes of Intervention:   N/A  Additional Comments:  Patient did not attend group.   Kennieth Francois 06/19/2023, 8:35 PM

## 2023-06-20 DIAGNOSIS — F323 Major depressive disorder, single episode, severe with psychotic features: Secondary | ICD-10-CM | POA: Diagnosis not present

## 2023-06-20 LAB — URINALYSIS, ROUTINE W REFLEX MICROSCOPIC
Bilirubin Urine: NEGATIVE
Glucose, UA: NEGATIVE mg/dL
Hgb urine dipstick: NEGATIVE
Ketones, ur: NEGATIVE mg/dL
Leukocytes,Ua: NEGATIVE
Nitrite: NEGATIVE
Protein, ur: NEGATIVE mg/dL
Specific Gravity, Urine: 1.003 — ABNORMAL LOW (ref 1.005–1.030)
pH: 6 (ref 5.0–8.0)

## 2023-06-20 MED ORDER — OLANZAPINE 10 MG PO TBDP: ORAL_TABLET | ORAL | 0 refills | Status: DC

## 2023-06-20 MED ORDER — SULFAMETHOXAZOLE-TRIMETHOPRIM 800-160 MG PO TABS: 1.0000 | ORAL_TABLET | Freq: Two times a day (BID) | ORAL | 0 refills | Status: AC

## 2023-06-20 MED ORDER — HYDROXYZINE HCL 25 MG PO TABS: 25.0000 mg | ORAL_TABLET | ORAL | 0 refills | Status: DC | PRN
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
## 2023-06-20 NOTE — Discharge Summary (Signed)
Physician Discharge Summary Note  Patient:  Angel [name] is an 32 y.o., female MRN:  [id number] DOB:  10-Dec-1990 Patient phone:  [phone] (home)  Patient address:   [address] [place] [address],  Total Time spent with patient: 20 minutes  Date of Admission:  06/07/2023 Date of Discharge: 06-20-2023  Reason for Admission:    Angel [name] is a 32 y.o. female patient admitted with c/o midsternal chest pain that started two days ago. Pt reports pain is "cramping and heavy". Pt tearful in triage.  Patient was evaluated and recommended for admission to [facility] for treatment of manic epsidoe.     Principal Problem: Major depressive disorder, single episode, severe with psychotic features [facility]) Discharge Diagnoses: Principal Problem:   Major depressive disorder, single episode, severe with psychotic features (HCC) Active Problems:   MDD (major depressive disorder)   Insomnia   Past Psychiatric History:  Denies any previous psych dx, treatment, hospitalization, suicide attempts.   Past Medical History:  Past Medical History:  Diagnosis Date   Kidney stones    PCOS (polycystic ovarian syndrome)     Past Surgical History:  Procedure Laterality Date   APPENDECTOMY  2012   in [place]   CESAREAN SECTION N/A 03/13/2022   Procedure: CESAREAN SECTION;  Surgeon: Milas Hock, MD;  Location: [facility] LD ORS;  Service: Obstetrics;  Laterality: N/A;   Kidney stone removal  2021   and 2014   RHINOPLASTY     Family History:  Family History  Problem Relation Age of Onset   Hypertension Mother    Kidney Stones Mother    Hypertension Father    Kidney Stones Father    Thyroid disease Sister    Seizures Sister    Polycystic ovary syndrome Sister    Family Psychiatric  History: See H&P   Social History:  Social History   Substance and Sexual Activity  Alcohol Use No     Social History   Substance and Sexual Activity  Drug Use No    Social History   Socioeconomic History    Marital status: Legally Separated    Spouse name: pt declined   Number of children: 1   Years of education: Not on file   Highest education level: Patient refused  Occupational History   Not on file  Tobacco Use   Smoking status: Never   Smokeless tobacco: Never  Vaping Use   Vaping status: Never Used  Substance and Sexual Activity   Alcohol use: No   Drug use: No   Sexual activity: Not Currently    Birth control/protection: None  Other Topics Concern   Not on file  Social History Narrative   Not on file   Social Determinants of Health   Financial Resource Strain: Not on file  Food Insecurity: Food Insecurity Present (06/07/2023)   Hunger Vital Sign    Worried About Running Out of Food in the Last Year: Sometimes true    Ran Out of Food in the Last Year: Never true  Transportation Needs: No Transportation Needs (06/07/2023)   PRAPARE - Administrator, Civil Service (Medical): No    Lack of Transportation (Non-Medical): No  Physical Activity: Not on file  Stress: Not on file  Social Connections: Not on file    Hospital Course:   During the patient's hospitalization, patient had extensive initial psychiatric evaluation, and follow-up psychiatric evaluations every day.  Psychiatric diagnoses provided during admission:   Bipolar disorder, type 1  Patient's psychiatric medications were adjusted on admission:  -Start zyprexa 5 mg at bedtime   During the hospitalization, other adjustments were made to the patient's psychiatric medication regimen:  -Zyprexa was increased to 10 mg qam and 20 mg at bedtime  Pt was on forced medication protocol for 7 days, which expired and was not renewed. Pt has been accepting medications w/o forced medication protocol.   Patient's care was discussed during the interdisciplinary team meeting every day during the hospitalization.  The patient denied having side effects to prescribed psychiatric medication.  Gradually, patient  started adjusting to milieu. The patient was evaluated each day by a clinical provider to ascertain response to treatment. Improvement was noted by the patient's report of decreasing symptoms, improved sleep and appetite, affect, medication tolerance, behavior, and participation in unit programming.  Patient was asked each day to complete a self inventory noting mood, mental status, pain, new symptoms, anxiety and concerns.    Symptoms were reported as significantly decreased or resolved completely by discharge.   On day of discharge, the patient reports that their mood is stable. The patient denied having suicidal thoughts for more than 48 hours prior to discharge.  Patient denies having homicidal thoughts.  Patient denies having auditory hallucinations.  Patient denies any visual hallucinations or other symptoms of psychosis. The patient was motivated to continue taking medication with a goal of continued improvement in mental health.   The patient reports their target psychiatric symptoms of mania responded well to the psychiatric medications, and the patient reports overall benefit other psychiatric hospitalization. Supportive psychotherapy was provided to the patient. The patient also participated in regular group therapy while hospitalized. Coping skills, problem solving as well as relaxation therapies were also part of the unit programming.  Labs were reviewed with the patient, and abnormal results were discussed with the patient.  The patient is able to verbalize their individual safety plan to this provider.  # It is recommended to the patient to continue psychiatric medications as prescribed, after discharge from the hospital.    # It is recommended to the patient to follow up with your outpatient psychiatric provider and PCP.  # It was discussed with the patient, the impact of alcohol, drugs, tobacco have been there overall psychiatric and medical wellbeing, and total abstinence from  substance use was recommended the patient.ed.  # Prescriptions provided or sent directly to preferred pharmacy at discharge. Patient agreeable to plan. Given opportunity to ask questions. Appears to feel comfortable with discharge.    # In the event of worsening symptoms, the patient is instructed to call the crisis hotline, 911 and or go to the nearest ED for appropriate evaluation and treatment of symptoms. To follow-up with primary care provider for other medical issues, concerns and or health care needs  # Patient was discharged to home, with a plan to follow up as noted below.   Physical Findings: AIMS:  , ,  ,  ,    CIWA:    COWS:     Aims score zero on my exam. No eps on my exam.    Musculoskeletal: Strength & Muscle Tone: within normal limits Gait & Station: normal Patient leans: N/A   Psychiatric Specialty Exam:  Presentation  General Appearance:  Fairly Groomed; Casual  Eye Contact: Good  Speech: Normal Rate; Clear and Coherent  Speech Volume: Normal  Handedness: Right   Mood and Affect  Mood: Euthymic; Anxious  Affect: Appropriate; Congruent; Full Range   Thought Process  Thought Processes: Linear  Descriptions of Associations:Intact  Orientation:Full (Time, Place and Person)  Thought Content:Logical  History of Schizophrenia/Schizoaffective disorder:No  Duration of Psychotic Symptoms:N/A  Hallucinations:Hallucinations: None  Ideas of Reference:None  Suicidal Thoughts:Suicidal Thoughts: No  Homicidal Thoughts:Homicidal Thoughts: No   Sensorium  Memory: Immediate Good; Recent Good; Remote Good  Judgment: Good  Insight: Good   Executive Functions  Concentration: Good  Attention Span: Good  Recall: Good  Fund of Knowledge: Good  Language: Good   Psychomotor Activity  Psychomotor Activity: Psychomotor Activity: Normal   Assets  Assets: Resilience   Sleep  Sleep: Sleep: Fair    Physical  Exam: Physical Exam ROS Blood pressure 98/68, pulse (!) 105, temperature 98.5 F (36.9 C), temperature source Oral, resp. rate 16, height 5\' 2"  (1.575 m), weight 41.7 kg, last menstrual period 06/04/2023, SpO2 99%, not currently breastfeeding. Body mass index is 16.83 kg/m.   Social History   Tobacco Use  Smoking Status Never  Smokeless Tobacco Never   Tobacco Cessation:  N/A, patient does not currently use tobacco products   Blood Alcohol level:  No results found for: "ETH"  Metabolic Disorder Labs:  Lab Results  Component Value Date   HGBA1C 4.8 06/09/2023   MPG 91.06 06/09/2023   No results found for: "PROLACTIN" Lab Results  Component Value Date   CHOL 147 06/09/2023   TRIG 57 06/09/2023   HDL 47 06/09/2023   CHOLHDL 3.1 06/09/2023   VLDL 11 06/09/2023   LDLCALC 89 06/09/2023   LDLCALC 43 08/14/2021    See Psychiatric Specialty Exam and Suicide Risk Assessment completed by Attending Physician prior to discharge.  Discharge destination:  Home  Is patient on multiple antipsychotic therapies at discharge:  No   Has Patient had three or more failed trials of antipsychotic monotherapy by history:  No  Recommended Plan for Multiple Antipsychotic Therapies: NA  Discharge Instructions     Diet - low sodium heart healthy   Complete by: As directed    Increase activity slowly   Complete by: As directed       Allergies as of 06/20/2023       Reactions   Levaquin [levofloxacin In D5w] Hives        Medication List     TAKE these medications      Indication  hydrOXYzine 25 MG tablet Commonly known as: ATARAX Take 1 tablet (25 mg total) by mouth every 4 (four) hours as needed for anxiety (Sleep).  Indication: Feeling Anxious   OLANZapine zydis 10 MG disintegrating tablet Commonly known as: ZYPREXA Take 1 tablet in the morning. Take 2 tablets at bedtime. Start taking on: June 21, 2023  Indication: Manic Phase of Manic-Depression    sulfamethoxazole-trimethoprim 800-160 MG tablet Commonly known as: BACTRIM DS Take 1 tablet by mouth every 12 (twelve) hours for 4 days.  Indication: Complicated Urinary Tract Infection, Urinary Tract Infection        Follow-up Information     [facility] Follow up on 07/09/2023.   Specialty: Behavioral Health Why: You have an appointment for medication management services on 07/09/23 at 11:00 am with Dr. Gilmore Laroche, a MyChart Virtual Visit.  You also have a therapy appointment on 07/17/23 at 1 pm with Josh (this appt will be in person). Contact information: [address] [place] [address] [phone]                Follow-up recommendations:  Activity: as tolerated  Diet: heart healthy  Other: -Follow-up with your outpatient psychiatric provider -instructions on appointment date, time, and address (location) are provided to you in discharge paperwork.  -Take your psychiatric medications as prescribed at discharge - instructions are provided to you in the discharge paperwork  -Follow-up with outpatient primary care doctor and other specialists -for management of preventative medicine and chronic medical disease  -If you are prescribed an atypical antipsychotic medication, we recommend that your outpatient psychiatrist follow routine screening for side effects within 3 months of discharge, including monitoring: AIMS scale, height, weight, blood pressure, fasting lipid panel, HbA1c, and fasting blood sugar.   -Recommend total abstinence from alcohol, tobacco, and other illicit drug use at discharge.   -If your psychiatric symptoms recur, worsen, or if you have side effects to your psychiatric medications, call your outpatient psychiatric provider, 911, 988 or go to the nearest emergency department.  -If suicidal thoughts occur, immediately call your outpatient psychiatric provider, 911, 988  or go to the nearest emergency department.   Signed: Cristy Hilts, MD 06/20/2023, 2:37 PM  Total Time Spent in Direct Patient Care:  I personally spent 65 minutes on the unit in direct patient care (including 25 minutes of IVC hearing). The direct patient care time included face-to-face time with the patient, reviewing the patient's chart, communicating with other professionals, and coordinating care. Greater than 50% of this time was spent in counseling or coordinating care with the patient regarding goals of hospitalization, psycho-education, and discharge planning needs.   Phineas Inches, MD Psychiatrist
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
## 2023-06-20 NOTE — Group Note (Signed)
Recreation Therapy Group Note   Group Topic:Problem Solving  Group Date: 06/20/2023 Start Time: 1001 End Time: 1030 Facilitators: Phaedra Colgate-McCall, LRT,CTRS Location: [address] Dayroom   Group Topic: Communication, Team Building, Problem Solving  Goal Area(s) Addresses:  Patient will effectively work with peer towards shared goal.  Patient will identify skills used to make activity successful.  Patient will identify how skills used during activity can be applied to reach post d/c goals.   Intervention: STEM Activity- Glass blower/designer  Group Description: Tallest Pharmacist, community. In teams of 5-6, patients were given 11 craft pipe cleaners. Using the materials provided, patients were instructed to compete again the opposing team(s) to build the tallest free-standing structure from floor level. The activity was timed; difficulty increased by Clinical research associate as Production designer, theatre/television/film continued.  Systematically resources were removed with additional directions for example, placing one arm behind their back, working in silence, and shape stipulations. LRT facilitated post-activity discussion reviewing team processes and necessary communication skills involved in completion. Patients were encouraged to reflect how the skills utilized, or not utilized, in this activity can be incorporated to positively impact support systems post discharge.  Education: Pharmacist, community, Scientist, physiological, Discharge Planning   Education Outcome: Acknowledges education/In group clarification offered/Needs additional education.    Affect/Mood: Appropriate   Participation Level: Active   Participation Quality: Independent   Behavior: Appropriate   Speech/Thought Process: Relevant   Insight: Moderate   Judgement: Moderate   Modes of Intervention: STEM Activity   Patient Response to Interventions:  Engaged   Education Outcome:  In group clarification offered    Clinical Observations/Individualized Feedback:  Pt was more observant at beginning of group. Pt eventually became more engaged. Pt was social with peers and assisted with coming up with ideas. Pt would veer off into other topics of conversation such as having a court date and having pain in her hip. Pt would refocus with some encouragement from peers and LRT.    Plan: Continue to engage patient in RT group sessions 2-3x/week.   Loranzo Desha-McCall, LRT,CTRS 06/20/2023 10:56 AM

## 2023-06-20 NOTE — Discharge Instructions (Signed)
-  Follow-up with your outpatient psychiatric provider -instructions on appointment date, time, and address (location) are provided to you in discharge paperwork.  -Take your psychiatric medications as prescribed at discharge - instructions are provided to you in the discharge paperwork As we discussed, your prescriptions will be printed at discharge.   -Follow-up with outpatient primary care doctor and other specialists -for management of preventative medicine and any chronic medical disease.  -Recommend abstinence from alcohol, tobacco, and other illicit drug use at discharge.   -If your psychiatric symptoms recur, worsen, or if you have side effects to your psychiatric medications, call your outpatient psychiatric provider, 911, [phone] or go to the nearest emergency department.  -If suicidal thoughts occur, call your outpatient psychiatric provider, 911, 988 or go to the nearest emergency department.  Naloxone (Narcan) can help reverse an overdose when given to the victim quickly.  [facility] offers free naloxone kits and instructions/training on its use.  Add naloxone to your first aid kit and you can help save a life.   Pick up your free kit at the following locations:   [place]:  [facility], [address] [place] [address] [phone]) [facility] [address] [place] [address] [phone]) [facility] [address] [place] [address]  [place]: [facility] [address] [place] [address] ([phone]) [facility] [address] [place] [address] [phone])

## 2023-06-20 NOTE — Progress Notes (Addendum)
  [facility] Adult Case Management Discharge Plan :  Will you be returning to the same living situation after discharge:  Yes,  [employer] was contacted to take pt to her car At [facility] in  [place] At discharge, do you have transportation home?: Yes,  CSW will arrange a taxi for patient  @ 3:30 PM  Do you have the ability to pay for your medications: Yes,  [facility]   Release of information consent forms completed and in the chart;  Patient's signature needed at discharge.  Patient to Follow up at:  Follow-up Information     [facility] Follow up on 07/09/2023.   Specialty: Behavioral Health Why: You have an appointment for medication management services on 07/09/23 at 11:00 am with Dr. Gilmore Laroche, a MyChart Virtual Visit.  You also have a therapy appointment on 07/17/23 at 1 pm with Josh (this appt will be in person). Contact information: [address] [place] [address] [phone]        Levie Heritage, DO. Call on 06/23/2023.   Specialty: Family Medicine Why: Please call this provider on Monday 06/23/2023 @ 8:15 AM to schedule your vaccinations appointment. This provider had closed early today @ 11 AM. Contact information: [address] Floor [place] [address] [phone]                 Next level of care provider has access to [facility] Link:yes  Safety Planning and Suicide Prevention discussed: Yes,  Parents in [place]      Has patient been referred to the Quitline?: Patient does not use tobacco/nicotine products  Patient has been referred for addiction treatment: No known substance use disorder.  Isabella Bowens, LCSWA 06/20/2023, 2:41 PM

## 2023-06-20 NOTE — Transportation (Signed)
06/20/2023  [name] DOB: 07-Sep-1991 MRN: [id number]   RIDER WAIVER AND RELEASE OF LIABILITY  For the purposes of helping with transportation needs, [facility] partners with outside transportation providers (taxi companies, [place], Catering manager.) to give [employer] patients or other approved people the choice of on-demand rides [employer]") to our buildings for non-emergency visits.  By using [employer], I, the person signing this document, on behalf of myself and/or any legal minors (in my care using the [employer]), agree:  Science writer given to me are supplied by independent, outside transportation providers who do not work for, or have any affiliation with, [employer]. [facility] is not a transportation company. [facility] has no control over the quality or safety of the rides I get using [employer]. [employer] has no control over whether any outside ride will happen on time or not. [facility] gives no guarantee on the reliability, quality, safety, or availability on any rides, or that no mistakes will happen. I know and accept that traveling by vehicle (car, truck, SVU, Zenaida Niece, bus, taxi, etc.) has risks of serious injuries such as disability, being paralyzed, and death. I know and agree the risk of using [employer] is mine alone, and not [employer]. Transport Services are provided "as is" and as are available. The transportation providers are in charge for all inspections and care of the vehicles used to provide these rides. I agree not to take legal action against [facility], its agents, employees, officers, directors, representatives, insurers, attorneys, assigns, successors, subsidiaries, and affiliates at any time for any reasons related directly or indirectly to using [employer]. I also agree not to take legal action against [facility] or its affiliates for any injury, death, or damage to property caused by or related to using  [employer]. I have read this Waiver and Release of Liability, and I understand the terms used in it and their legal meaning. This Waiver is freely and voluntarily given with the understanding that my right (or any legal minors) to legal action against  relating to [employer] is knowingly given up to use these services.   I attest that I read the Ride Waiver and Release of Liability to [name], gave Ms. Payes the opportunity to ask questions and answered the questions asked (if any). I affirm that [name] then provided consent for assistance with transportation.

## 2023-06-20 NOTE — Progress Notes (Signed)
D: Pt A & O to self, place, time and situation. Denies SI, HI, AVH and pain at this time. D/C home as ordered. Picked up in front of facility by [employer]. A: D/C instructions reviewed with pt including prescriptions and follow up appointments; compliance encouraged. All belongings from locker 13 returned to pt at time of departure. Scheduled medications given with verbal education and effects monitored. Safety checks maintained without incident till time of d/c.  R: Pt receptive to care. Compliant with medications when offered. Denies adverse drug reactions when assessed. Verbalized understanding related to d/c instructions. Signed belonging sheet in agreement with items received from locker. Ambulatory with a steady gait. Appears to be in no physical distress at time of departure.

## 2023-06-27 IMAGING — US US MFM OB FOLLOW-UP
1 series · 14 of 28 positions shown · non-contrast
Comparison: none

[Series 1: us mfm ob follow-up · 44 acquisitions, 14 frames shown]
[im 2/44]
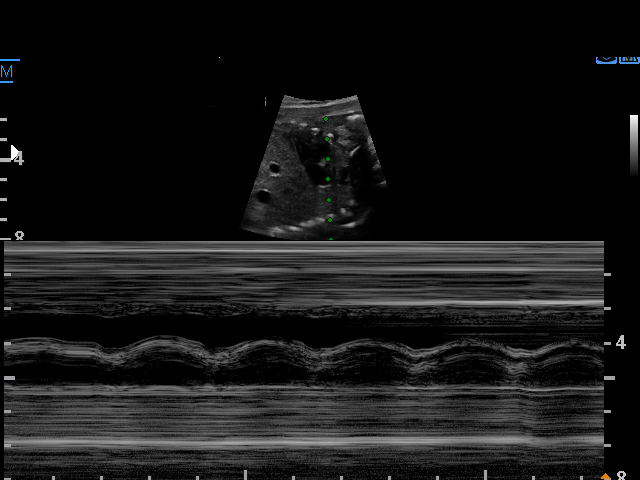
[im 5/44]
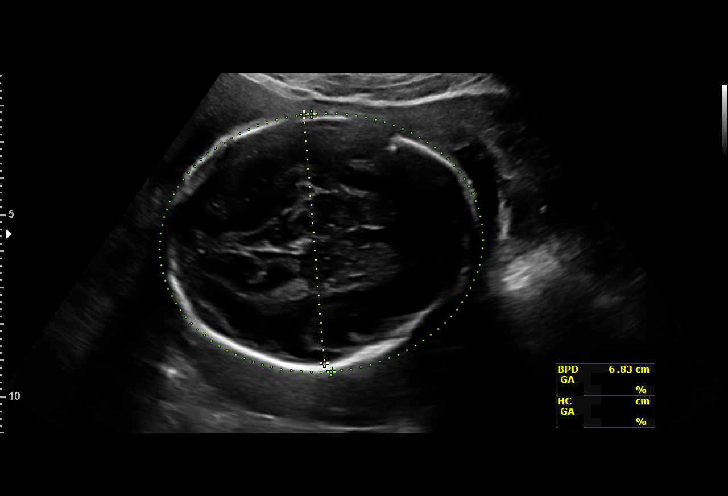
[im 8/44]
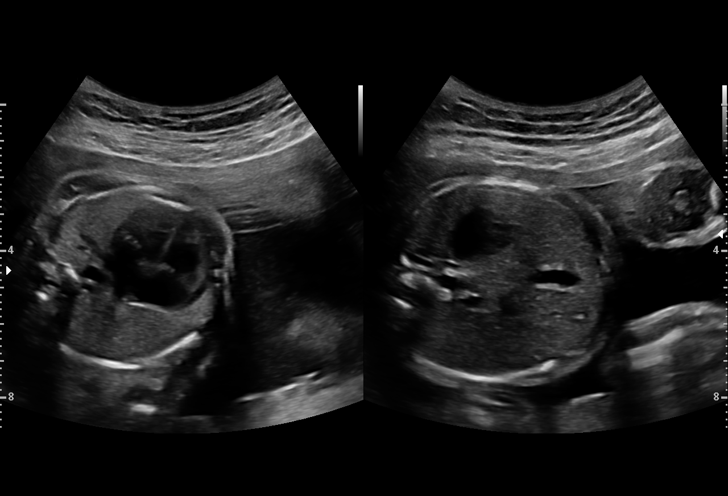
[im 12/44]
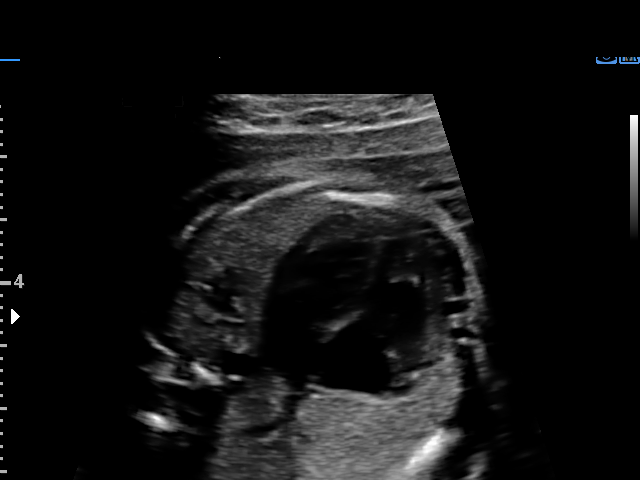
[im 15/44]
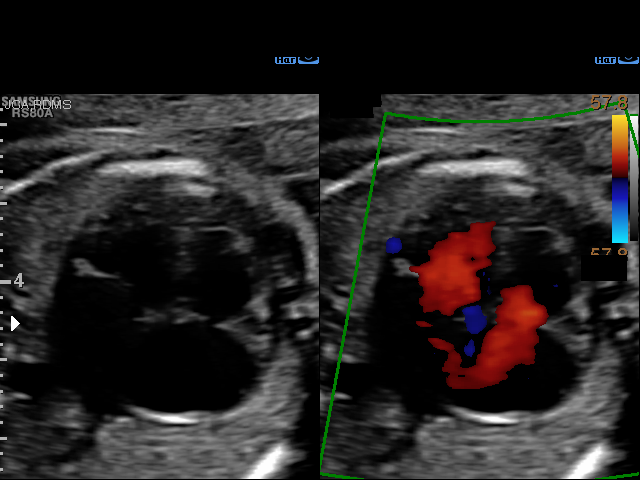
[im 18/44]
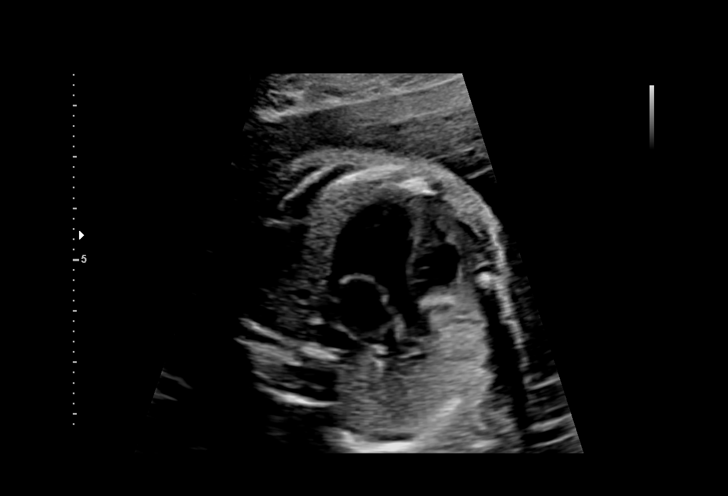
[im 21/44]
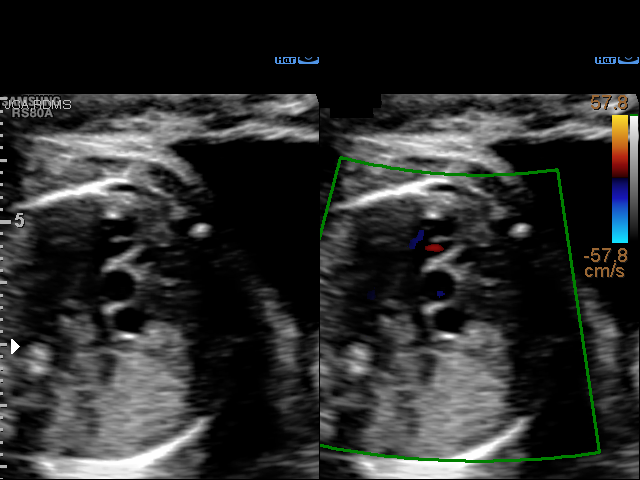
[im 24/44]
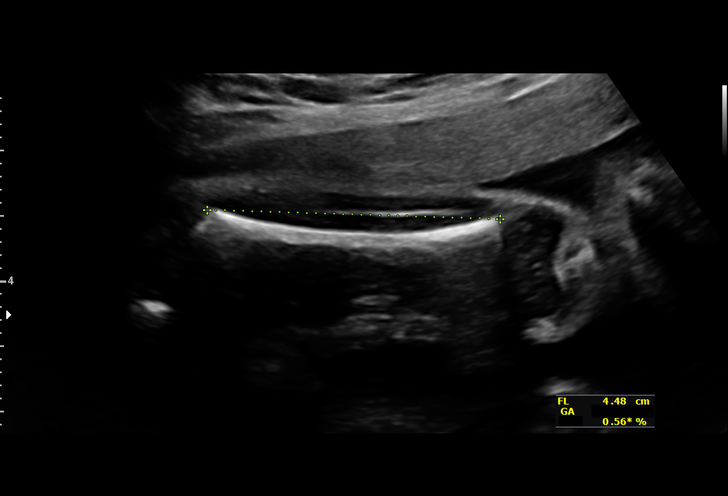
[im 28/44]
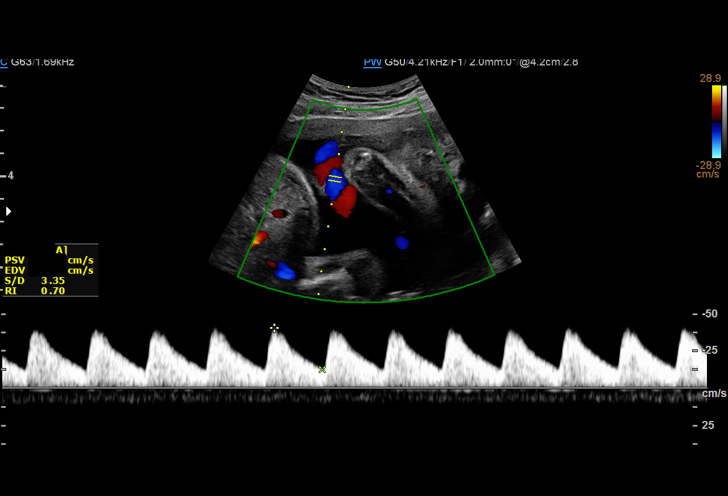
[im 31/44]
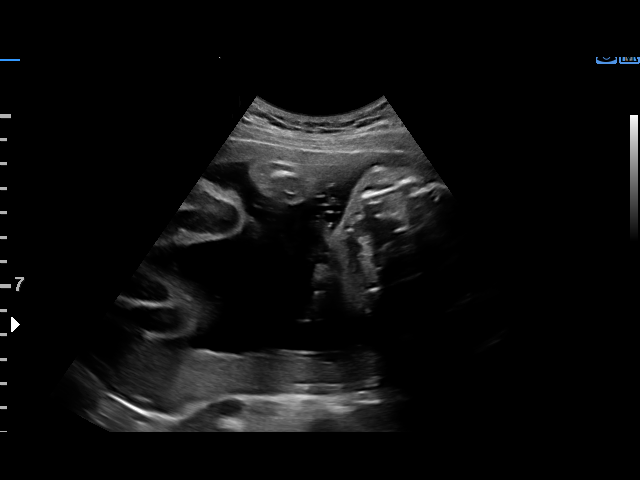
[im 34/44]
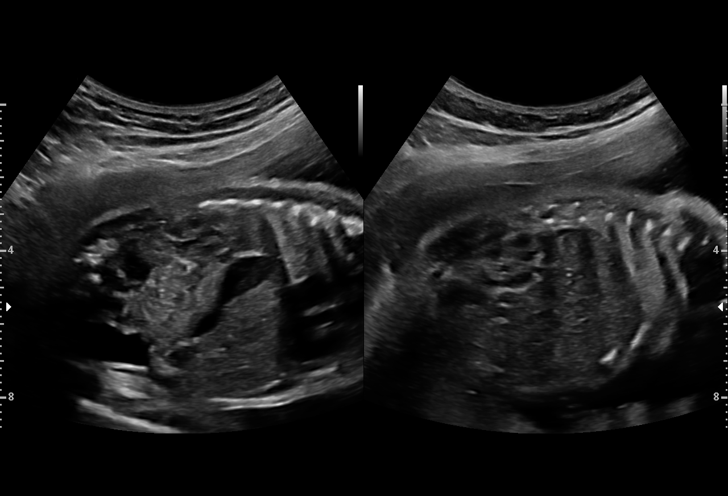
[im 37/44]
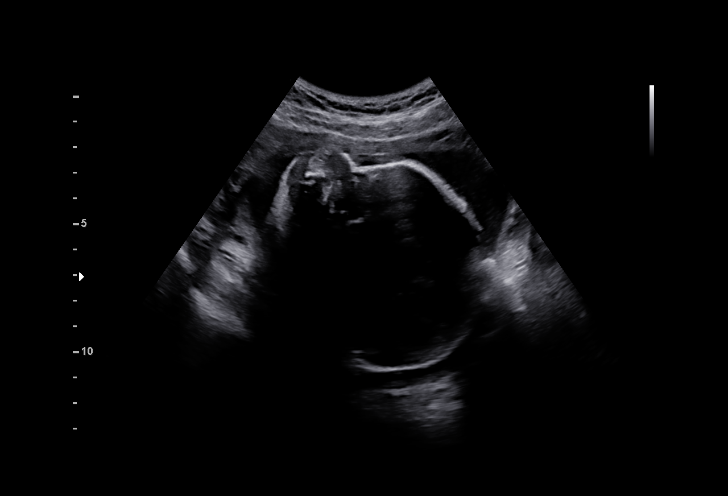
[im 40/44]
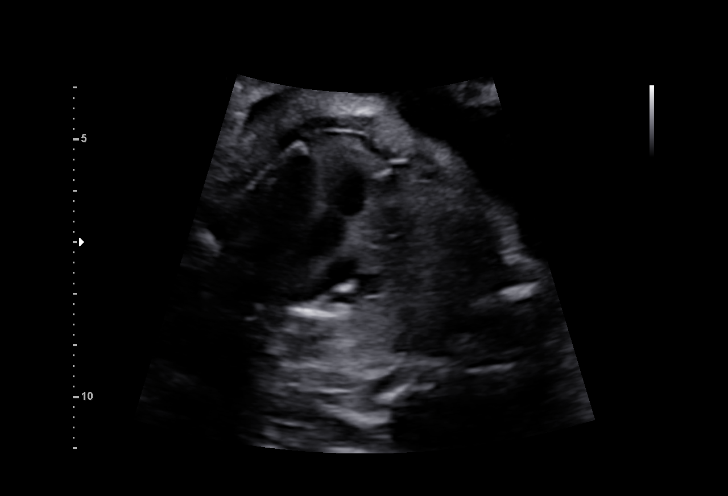
[im 44/44]
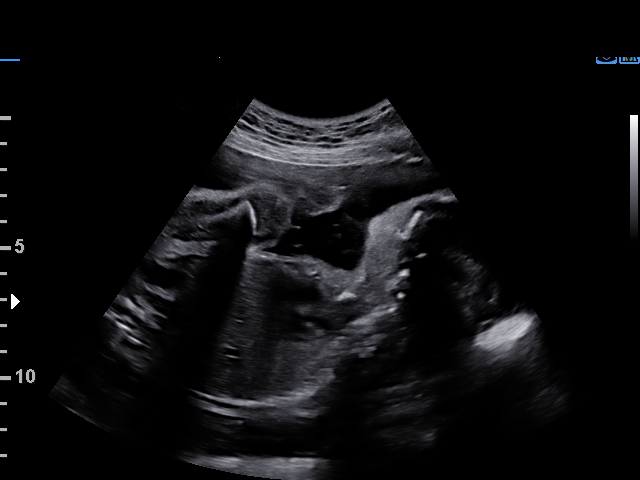

[14 of 28 positions shown; findings below may reference images not displayed]

Indications

 Maternal care for known or suspected poor
 fetal growth, second trimester, not applicable
 or unspecified IUGR
 27 weeks gestation of pregnancy
 Encounter for other antenatal screening
 follow-up
 Low risk NIPS
Fetal Evaluation

 Num Of Fetuses:         1
 Fetal Heart Rate(bpm):  140
 Cardiac Activity:       Observed
 Presentation:           Cephalic
 Placenta:               Posterior
 P. Cord Insertion:      Previously Visualized

 Amniotic Fluid
 AFI FV:      Within normal limits

                             Largest Pocket(cm)

Biometry

 BPD:      68.1  mm     G. Age:  27w 3d         37  %    CI:        73.64   %    70 - 86
                                                         FL/HC:      18.4   %    18.6 -
 HC:      252.1  mm     G. Age:  27w 3d         20  %    HC/AC:      1.24        1.05 -
 AC:      203.5  mm     G. Age:  25w 0d        1.3  %    FL/BPD:     68.1   %    71 - 87
 FL:       46.4  mm     G. Age:  25w 3d        2.2  %    FL/AC:      22.8   %    20 - 24
 Est. FW:     815  gm    1 lb 13 oz     1.6  %
OB History

 Gravidity:    1         Term:   0        Prem:   0        SAB:   0
 TOP:          0       Ectopic:  0        Living: 0
Gestational Age

 LMP:           27w 3d        Date:  06/26/21                 EDD:   04/02/22
 U/S Today:     26w 2d                                        EDD:   04/10/22
 Best:          27w 3d     Det. By:  LMP  (06/26/21)          EDD:   04/02/22
Anatomy

 Cranium:               Appears normal         Aortic Arch:            Previously seen
 Cavum:                 Previously seen        Ductal Arch:            Previously seen
 Ventricles:            Appears normal         Diaphragm:              Previously seen
 Choroid Plexus:        Previously seen        Stomach:                Appears normal, left
                                                                       sided
 Cerebellum:            Previously seen        Abdomen:                Appears normal
 Posterior Fossa:       Previously seen        Abdominal Wall:         Previously seen
 Nuchal Fold:           Not applicable (>20    Cord Vessels:           Previously seen
                        wks GA)
 Face:                  Orbits and profile     Kidneys:                Appear normal
                        previously seen
 Lips:                  Previously seen        Bladder:                Previously seen
 Thoracic:              Appears normal         Spine:                  Previously seen
 Heart:                 Appears normal         Upper Extremities:      Previously seen
                        (4CH, axis, and
                        situs)
 RVOT:                  Appears normal         Lower Extremities:      Previously seen
 LVOT:                  Appears normal

 Other:  Heels/feet, open hands/5th digits, nasal bone, lenses,.VC, 3VV and
         3VTV previously visualized.
Doppler - Fetal Vessels

 Umbilical Artery
  S/D     %tile      RI    %tile                             ADFV    RDFV
  3.15       54    0.68       57                                No      No

Comments

 This patient was seen for a follow up growth scan due to fetal
 growth restriction.  She denies any problems since her last
 exam and reports feeling vigorous fetal movements
 throughout the day.
 On today's exam, the EFW measures at the second
 percentile.  The fetus has grown over half a pound over the
 past 3 weeks.  There was normal amniotic fluid noted.
 Fetal movements were noted throughout today's exam.
 Doppler studies of the umbilical arteries showed a normal
 S/D ratio of 3.15.  There were no signs of absent or reversed
 end-diastolic flow.
 Due to fetal growth restriction, we will continue to follow her
 with weekly fetal testing and umbilical artery Doppler studies.
 Another umbilical artery Doppler study and NST was
 scheduled in 1 week.

## 2023-07-04 ENCOUNTER — Encounter: Payer: Self-pay | Admitting: Family Medicine

## 2023-07-04 ENCOUNTER — Ambulatory Visit (INDEPENDENT_AMBULATORY_CARE_PROVIDER_SITE_OTHER): Payer: Managed Care, Other (non HMO) | Admitting: Family Medicine

## 2023-07-04 VITALS — BP 100/80 | HR 98 | Temp 98.2°F | Resp 16 | Ht 62.0 in | Wt 104.4 lb

## 2023-07-04 DIAGNOSIS — F418 Other specified anxiety disorders: Secondary | ICD-10-CM | POA: Diagnosis not present

## 2023-07-04 DIAGNOSIS — H538 Other visual disturbances: Secondary | ICD-10-CM | POA: Diagnosis not present

## 2023-07-04 DIAGNOSIS — R350 Frequency of micturition: Secondary | ICD-10-CM | POA: Diagnosis not present

## 2023-07-04 DIAGNOSIS — T7411XA Adult physical abuse, confirmed, initial encounter: Secondary | ICD-10-CM | POA: Diagnosis not present

## 2023-07-04 DIAGNOSIS — R12 Heartburn: Secondary | ICD-10-CM | POA: Diagnosis not present

## 2023-07-04 DIAGNOSIS — K625 Hemorrhage of anus and rectum: Secondary | ICD-10-CM | POA: Insufficient documentation

## 2023-07-04 DIAGNOSIS — F323 Major depressive disorder, single episode, severe with psychotic features: Secondary | ICD-10-CM

## 2023-07-04 LAB — POC URINALSYSI DIPSTICK (AUTOMATED)
Bilirubin, UA: NEGATIVE
Blood, UA: NEGATIVE
Glucose, UA: NEGATIVE
Ketones, UA: NEGATIVE
Nitrite, UA: NEGATIVE
Protein, UA: NEGATIVE
Spec Grav, UA: 1.01 (ref 1.010–1.025)
Urobilinogen, UA: 0.2 U/dL
pH, UA: 5 (ref 5.0–8.0)

## 2023-07-04 MED ORDER — OLANZAPINE 10 MG PO TBDP: ORAL_TABLET | ORAL | Status: DC

## 2023-07-04 MED ORDER — FAMOTIDINE 20 MG PO TABS
20.0000 mg | ORAL_TABLET | Freq: Two times a day (BID) | ORAL | 5 refills | Status: DC
Start: 2023-07-04 — End: 2023-07-11

## 2023-07-04 NOTE — Assessment & Plan Note (Signed)
Check labs 

## 2023-07-04 NOTE — Assessment & Plan Note (Signed)
Pt to see psych next week Did attempt to send a message to the psych she has app with

## 2023-07-04 NOTE — Patient Instructions (Signed)
Vita D3 2000u daily     Vitamin D Capsules or Tablets What is this medication? VITAMIN D (VAHY tuh min D) prevents and treats low vitamin D levels in your body. It works by increasing the amount of calcium absorbed by your body. Vitamin D and calcium help build and maintain the health of your bones. Vitamin D also plays an important role in supporting your immune system and brain health. This medicine may be used for other purposes; ask your health care provider or pharmacist if you have questions. COMMON BRAND NAME(S): D3 2000, D3 5000, DECARA, Deltalin, Dialyvite, Dialyvite Vitamin D, Dialyvite Vitamin D3, Drisdol, Ergo D, Happy Sunshine Vitamin D3, MAXIMUM D3, PureMark Naturals Vitamin D, Super Happy SUNSHINE Vitamin D3, Thera-D 2000, Thera-D 4000, Thera-D Rapid Repletion, THERA-D SPORT, WEEKLY-D What should I tell my care team before I take this medication? They need to know if you have any of the following conditions: Cystic fibrosis Gallbladder disease High levels of calcium in the blood High levels of vitamin D in the blood Inflammatory bowel disease such, as Crohn's disease or ulcerative colitis Kidney disease Liver disease Parathyroid disease Other stomach disease An unusual or allergic reaction to vitamin D, other medications, foods, dyes, or preservatives Pregnant or trying to get pregnant Breast-feeding How should I use this medication? Take this medication by mouth with water. Take it as directed on the label at the same time every day. Take it with a meal or snack; for best results, take it with foods that contain fat (i.e., milk, yogurt, cheese). Do not use it more often than directed. Talk to your care team about the use of this medication in children. Special care may be needed. Overdosage: If you think you have taken too much of this medicine contact a poison control center or emergency room at once. NOTE: This medicine is only for you. Do not share this medicine with  others. What if I miss a dose? If you miss a dose, take it as soon as you can. If it is almost time for your next dose, take only that dose. Do not take double or extra doses. What may interact with this medication? Antacids Diuretics Magnesium supplements Medications for cholesterol, such as cholestyramine, colesevelam, or colestipol Medications for seizures, such as phenytoin, fosphenytoin Mineral oil Orlistat Phosphorus supplements Rifampin This list may not describe all possible interactions. Give your health care provider a list of all the medicines, herbs, non-prescription drugs, or dietary supplements you use. Also tell them if you smoke, drink alcohol, or use illegal drugs. Some items may interact with your medicine. What should I watch for while using this medication? Visit your care team for regular checks on your progress. You may need blood work done while you are taking this medication. Tell your care team if your symptoms do not start to get better or if they get worse. Do not take any non-prescription medications that have vitamin D, phosphorus, magnesium, or calcium including antacids while taking this medication, unless your care team says you can. The extra supplements can cause side effects. What side effects may I notice from receiving this medication? Side effects that you should report to your care team as soon as possible: Allergic reactions--skin rash, itching, hives, swelling of the face, lips, tongue, or throat High calcium level--increased thirst or amount of urine, nausea, vomiting, confusion, unusual weakness or fatigue, bone pain Side effects that usually do not require medical attention (report these to your care team if they continue  or are bothersome): Constipation Loss of appetite Nausea This list may not describe all possible side effects. Call your doctor for medical advice about side effects. You may report side effects to FDA at 1-800-FDA-1088. Where  should I keep my medication? Keep out of the reach of children and pets. Store at room temperature between 15 and 30 degrees C (59 and 86 degrees F). Protect from light. Get rid of any unused medication after the expiration date. To get rid of medications that are no longer needed or have expired: Take the medication to a medication take-back program. Check with your pharmacy or law enforcement to find a location. If you cannot return the medication, check the label or package insert to see if the medication should be thrown out in the garbage or flushed down the toilet. If you are not sure, ask your care team. If it is safe to put it in the trash, take the medication out of the container. Mix the medication with cat litter, dirt, coffee grounds, or other unwanted substance. Seal the mixture in a bag or container. Put it in the trash. NOTE: This sheet is a summary. It may not cover all possible information. If you have questions about this medicine, talk to your doctor, pharmacist, or health care provider.  2024 Elsevier/Gold Standard (2021-06-04 00:00:00)

## 2023-07-04 NOTE — Assessment & Plan Note (Signed)
Check I fob
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
## 2023-07-04 NOTE — Assessment & Plan Note (Signed)
Pepcid bid  Consider GI referral

## 2023-07-08 ENCOUNTER — Ambulatory Visit: Payer: Managed Care, Other (non HMO)

## 2023-07-08 ENCOUNTER — Other Ambulatory Visit (INDEPENDENT_AMBULATORY_CARE_PROVIDER_SITE_OTHER): Payer: Managed Care, Other (non HMO)

## 2023-07-08 VITALS — BP 96/57 | HR 96 | Wt 106.0 lb

## 2023-07-08 DIAGNOSIS — Z23 Encounter for immunization: Secondary | ICD-10-CM

## 2023-07-08 DIAGNOSIS — K625 Hemorrhage of anus and rectum: Secondary | ICD-10-CM

## 2023-07-08 NOTE — Progress Notes (Signed)
Angel [name] here for  HPV   Injection.  Injection administered without complication. Patient will return in  as needed  for next injection.  Rene Sizelove l Jene Oravec, CMA 07/08/2023  3:24 PM

## 2023-07-09 ENCOUNTER — Encounter (HOSPITAL_COMMUNITY): Payer: Self-pay | Admitting: Psychiatry

## 2023-07-09 ENCOUNTER — Telehealth (INDEPENDENT_AMBULATORY_CARE_PROVIDER_SITE_OTHER): Payer: 59 | Admitting: Psychiatry

## 2023-07-09 DIAGNOSIS — F323 Major depressive disorder, single episode, severe with psychotic features: Secondary | ICD-10-CM

## 2023-07-09 DIAGNOSIS — F411 Generalized anxiety disorder: Secondary | ICD-10-CM | POA: Diagnosis not present

## 2023-07-09 DIAGNOSIS — F418 Other specified anxiety disorders: Secondary | ICD-10-CM | POA: Diagnosis not present

## 2023-07-09 LAB — FECAL OCCULT BLOOD, IMMUNOCHEMICAL: Fecal Occult Bld: NEGATIVE

## 2023-07-09 MED ORDER — HYDROXYZINE HCL 25 MG PO TABS: 25.0000 mg | ORAL_TABLET | Freq: Two times a day (BID) | ORAL | 0 refills | Status: AC | PRN

## 2023-07-09 MED ORDER — OLANZAPINE 10 MG PO TBDP: ORAL_TABLET | ORAL | 1 refills | Status: DC
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
## 2023-07-11 ENCOUNTER — Ambulatory Visit (INDEPENDENT_AMBULATORY_CARE_PROVIDER_SITE_OTHER): Payer: Managed Care, Other (non HMO) | Admitting: Family Medicine

## 2023-07-11 ENCOUNTER — Encounter: Payer: Self-pay | Admitting: Family Medicine

## 2023-07-11 VITALS — BP 98/70 | HR 114 | Temp 97.8°F | Resp 16 | Ht 62.0 in | Wt 104.0 lb

## 2023-07-11 DIAGNOSIS — F418 Other specified anxiety disorders: Secondary | ICD-10-CM

## 2023-07-11 DIAGNOSIS — F332 Major depressive disorder, recurrent severe without psychotic features: Secondary | ICD-10-CM

## 2023-07-11 DIAGNOSIS — R12 Heartburn: Secondary | ICD-10-CM | POA: Diagnosis not present

## 2023-07-11 DIAGNOSIS — T7411XA Adult physical abuse, confirmed, initial encounter: Secondary | ICD-10-CM | POA: Diagnosis not present

## 2023-07-11 MED ORDER — FAMOTIDINE 20 MG PO TABS: 20.0000 mg | ORAL_TABLET | Freq: Two times a day (BID) | ORAL | 5 refills | Status: AC
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
## 2023-07-11 NOTE — Patient Instructions (Signed)
Domestic Violence and Pregnancy Intimate partner violence is any type of physical, sexual, or emotional harm done by a current or former partner. Emotional abuse includes threatening and controlling behavior. Intimate partner violence is also called domestic violence. Intimate partner violence is especially dangerous during pregnancy because the abuse may affect you and your developing baby. Call a domestic violence hotline or let your health care provider know if you are being physically or sexually abused, or if your partner's threatening or controlling behavior is making you feel unsafe. Getting help and support protects you, your pregnancy, and your baby. How does this affect me? Intimate partner violence can cause: Physical effects, such as: Injury or death (homicide). Digestion problems. Loss of appetite. Frequent infections, including sexually transmitted infections. High blood pressure. Emotional effects, such as: Fear and worry. Trouble sleeping. Depression. Anxiety. Thoughts of hurting yourself or committing suicide. Intimate partner violence may affect your pregnancy in these ways: You are more likely to be injured or have poor health. You may be less likely to get prenatal care. You may not gain a healthy amount of weight or have good nutrition. You may be more likely to smoke, use drugs, and drink alcohol to relieve stress. You may be at higher risk of losing your pregnancy (miscarriage or stillbirth). Your baby may be born before 37 weeks of pregnancy (premature). How does this affect my baby? If you experience intimate partner violence during pregnancy: Your baby may be injured. Your baby may not survive the pregnancy (miscarriage or stillbirth). Your baby may be born premature, which can cause mental and physical problems. Your baby may not grow well in your womb and may be born small (small for gestational age). If you use alcohol, your baby may be born with fetal  alcohol syndrome. This can cause birth defects and other problems. You may have trouble bonding with your baby. This can lead to neglect. You may be less likely to breastfeed. This is the healthiest way to feed your baby. Follow these instructions at home:  Let your health care provider know that you are experiencing intimate partner violence. Do not bring an abusive partner with you to your prenatal visits. This will allow you to speak freely with your health care provider. Learn about and use resources on intimate partner violence, such as the [employer] Violence Hotline: thehotline.org Consider getting counseling. Consider getting a legal document that says your partner has to stay away from you (restraining order). Have a support person stay with you in your home. Have an escape plan to get to a safe place. Do not smoke,use drugs, or drink alcohol to relieve stress. Keep all your prenatal visits as told. This is important. Where to find more information The [employer] Violence Hotline: 24-hour hotline: 772 406 8511 or 720-028-3630 (TTY) Website: thehotline.org The National Sexual Assault Hotline: 24-hour hotline: 858-395-6109 Website: rainn.org Love is respect: Text "loveis" to 989-294-8088 Call: 574 560 3314 or 754 459 8143 Gwenevere Abbot) Website: loveisrespect.org If you do not feel safe searching for help online at home, use a computer at a [employer] to access the internet. Call 911 if you are in immediate danger or need medical help. Contact a health care provider if: You experience any type of intimate partner violence at home. You need help to quit smoking, drinking, or taking drugs. Get help right away if: You do not feel safe at home. You have thoughts of hurting yourself or committing suicide. If you ever feel like you may hurt yourself or others, or have  thoughts about taking your own life, get help right away. Go to your nearest emergency department or: Call your  local emergency services (911 in the [place] Call a suicide crisis helpline, such as the National Suicide Prevention Lifeline at (217)290-1001 or 988 in the [place] This is open 24 hours a day in the [place] Text the Crisis Text Line at (215) 212-0966 (in the [place] Summary Intimate partner violence is also called domestic violence. This kind of violence can be physical, sexual, or emotional. Intimate partner violence is especially dangerous during pregnancy. It can increase your baby's risk of miscarriage, stillbirth, and premature birth. Tell your health care provider if you experience any type of intimate partner violence. Get help right away if you do not feel safe at home. This information is not intended to replace advice given to you by your health care provider. Make sure you discuss any questions you have with your health care provider. Document Revised: 12/18/2021 Document Reviewed: 01/07/2020 Elsevier Patient Education  2024 [employer].

## 2023-07-11 NOTE — Assessment & Plan Note (Signed)
Pt still living in the same house with spouse but in separate area Child is with her estranged husband Refer to sw to help with community resources, job etc

## 2023-07-11 NOTE — Assessment & Plan Note (Signed)
Per psych On zyprexa

## 2023-07-14 ENCOUNTER — Telehealth: Payer: Self-pay | Admitting: *Deleted

## 2023-07-14 NOTE — Progress Notes (Signed)
  Care Coordination   Note   07/14/2023 Name: Tiffannie Shells MRN: [id number] DOB: 1990/11/24  Afomia Hiracheta is a 32 y.o. year old female who sees Zola Button, Grayling Congress, DO for primary care. I reached out to [name] by phone today to offer care coordination services.  Ms. Kneale was given information about Care Coordination services today including:   The Care Coordination services include support from the care team which includes your Nurse Coordinator, Clinical Social Worker, or Pharmacist.  The Care Coordination team is here to help remove barriers to the health concerns and goals most important to you. Care Coordination services are voluntary, and the patient may decline or stop services at any time by request to their care team member.   Care Coordination Consent Status: Patient agreed to services and verbal consent obtained.   Follow up plan:  Telephone appointment with care coordination team member scheduled for:  07/18/2023  Encounter Outcome:  Patient Scheduled from referral   Burman Nieves, [facility] Care Coordination Care Guide Direct Dial: [phone]

## 2023-07-17 ENCOUNTER — Ambulatory Visit (INDEPENDENT_AMBULATORY_CARE_PROVIDER_SITE_OTHER): Payer: 59 | Admitting: Licensed Clinical Social Worker

## 2023-07-17 DIAGNOSIS — F323 Major depressive disorder, single episode, severe with psychotic features: Secondary | ICD-10-CM | POA: Diagnosis not present

## 2023-07-17 DIAGNOSIS — F411 Generalized anxiety disorder: Secondary | ICD-10-CM

## 2023-07-18 ENCOUNTER — Ambulatory Visit: Payer: Self-pay | Admitting: Licensed Clinical Social Worker
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
## 2023-07-18 NOTE — Patient Outreach (Signed)
  Care Coordination   Initial Visit Note   07/18/2023 Name: Angel [name] MRN: [id number] DOB: 1991/07/15  Angel [name] is a 32 y.o. year old female who sees Zola Button, Grayling Congress, DO for primary care. I spoke with  Angel [name] by phone today.  What matters to the patients health and wellness today?  Angel [name] requesting assistance locating her 53 month daughter. LCSW A. Felton Clinton advised Angel [name] Health and Care Coordination does not provide assistance for child custody. Angel [name] is urged to consult with a family attorney.     Goals Addressed             This Visit's Progress    C          SDOH assessments and interventions completed:  Yes  SDOH Interventions Today    Flowsheet Row Most Recent Value  SDOH Interventions   Food Insecurity Interventions Intervention Not Indicated  Utilities Interventions Intervention Not Indicated        Care Coordination Interventions:  No, not indicated   Follow up plan: No further intervention required.   Encounter Outcome:  Patient Visit Completed   Gwyndolyn Saxon MSW, LCSW Licensed Clinical Social Worker  [facility], Population Health Direct Dial: [phone]  Fax: [phone]

## 2023-08-06 ENCOUNTER — Telehealth (INDEPENDENT_AMBULATORY_CARE_PROVIDER_SITE_OTHER): Payer: 59 | Admitting: Psychiatry

## 2023-08-06 ENCOUNTER — Encounter (HOSPITAL_COMMUNITY): Payer: Self-pay | Admitting: Psychiatry

## 2023-08-06 DIAGNOSIS — F333 Major depressive disorder, recurrent, severe with psychotic symptoms: Secondary | ICD-10-CM

## 2023-08-06 DIAGNOSIS — F411 Generalized anxiety disorder: Secondary | ICD-10-CM | POA: Diagnosis not present

## 2023-08-06 DIAGNOSIS — F323 Major depressive disorder, single episode, severe with psychotic features: Secondary | ICD-10-CM

## 2023-08-06 MED ORDER — OLANZAPINE 20 MG PO TABS: 20.0000 mg | ORAL_TABLET | Freq: Every day | ORAL | 1 refills | Status: AC
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
## 2023-08-19 ENCOUNTER — Ambulatory Visit (INDEPENDENT_AMBULATORY_CARE_PROVIDER_SITE_OTHER): Payer: 59 | Admitting: Licensed Clinical Social Worker

## 2023-08-19 ENCOUNTER — Encounter (HOSPITAL_COMMUNITY): Payer: Self-pay

## 2023-08-19 DIAGNOSIS — F418 Other specified anxiety disorders: Secondary | ICD-10-CM | POA: Diagnosis not present

## 2023-08-19 DIAGNOSIS — F411 Generalized anxiety disorder: Secondary | ICD-10-CM | POA: Diagnosis not present

## 2023-08-20 NOTE — Progress Notes (Signed)
THERAPIST PROGRESS NOTE  Session Time: 3:00 pm-3:45 pm  Type of Therapy: Individual Therapy  Purpose of session/Treatment Goals addressed: Quintella will manage mood and anxiety as evidenced by returning to a previous level of functioning, increase zest for life, increase hope for future, and manage depressive and anxious thoughts for 5 out of 7 days for 60 days.   Interventions: Therapist utilized CBT, ACT, and Solution focused  brief therapy  to address mood and anxiety . Therapist provided support and empathy to patient during session.  Therapist administered the PHQ9 and the GAD7 . Therapist worked with patient to develop a treatment plan. Therapist processed patient's feelings to identify triggers. Therapist provided psychoeducation on CBT to help patient understand thoughts, feelings, and behaviors.    Effectiveness: Patient was oriented x4 (person, place, situation, and time) . Patient was  Appropriate. Patient was Well Groomed. Patient was able to identify goals she wanted to work on in treatment. Patient noted she recently signed divorce papers. Previously she was very worried about this but she is feeling better after she signed. She felt like she could start to move forward in her life. She is working on her routine including waking up at a regular time as well as going to bed at an appropriate time. Patient has been increasing her eating and taking vitamin D supplements. She is attending a local temple for her faith and prays. Patient has started to volunteer with the [employer] because she is unable to work until she gets approval from immigration. Patient is hopeful that she will be able to she her daughter in the future and this hope helps her continue to take care of herself. Patient noted that her former spouse asked her for signatures for a passport for their daughter. She declined due to worries that he will take their daughter to [place] and not return. Patient has accepted that some  things are beyond her control. This has reduced her anxiety. Patient understood CBT and how thoughts, feelings, and actions/behaviors are connected.   Patient engaged in session. Patient responded well to interventions. Patient continues to meet criteria for Depression with anxiety  GAD (generalized anxiety disorder)  Patient will continue in outpatient therapy due to being the least restrictive service to meet her needs. Patient made minimal progress on her goals at this time.     Suicidal/Homicidal:  No without intent/plan   Recent bereavement or loss of a relationship  Protective Factors: positive social support, responsibility to others (children, family), hope for the future, religious beliefs against suicide, and life satisfaction  Plan: Patient will return in 2-4 weeks. Patient will pay attention to thoughts, feelings, and behaviors.   Diagnosis: Axis I: No diagnosis found.    Collaboration of Care: Other PCP and Lawyer as needed  Patient/Guardian was advised Release of Information must be obtained prior to any record release in order to collaborate their care with an outside provider. Patient/Guardian was advised if they have not already done so to contact the registration department to sign all necessary forms in order for [place] to release information regarding their care.   Consent: Patient/Guardian gives verbal consent for treatment and assignment of benefits for services provided during this visit. Patient/Guardian expressed understanding and agreed to proceed.

## 2023-09-03 ENCOUNTER — Ambulatory Visit (INDEPENDENT_AMBULATORY_CARE_PROVIDER_SITE_OTHER): Payer: 59 | Admitting: Licensed Clinical Social Worker

## 2023-09-03 DIAGNOSIS — F411 Generalized anxiety disorder: Secondary | ICD-10-CM | POA: Diagnosis not present

## 2023-09-03 DIAGNOSIS — F418 Other specified anxiety disorders: Secondary | ICD-10-CM

## 2023-09-03 NOTE — Progress Notes (Signed)
THERAPIST PROGRESS NOTE  Session Time: 4:00 pm-4:45 pm  Type of Therapy: Individual Therapy  Purpose of session/Treatment Goals addressed: Takima will manage mood and anxiety as evidenced by returning to a previous level of functioning, increase zest for life, increase hope for future, and manage depressive and anxious thoughts for 5 out of 7 days for 60 days.   Interventions: Therapist utilized CBT, ACT, and Solution focused  brief therapy  to address mood and anxiety . Therapist provided support and empathy to patient during session.  Therapist administered the PHQ9 and the GAD7. Therapist worked with patient on increasing hope for future. Therapist reviewed and processed patient's hospitalization.    Effectiveness: Patient was oriented x4 (person, place, situation, and time) . Patient was  Appropriate. Patient was Well Groomed. Patient continues to have a routine. She is volunteering with the [employer] and continues to enjoy this. Patient continues to work on herself and stay positive. She is reaching out to her child's father to inquire how her daughter is doing and will get a response such as "She is doing well. Thanks." Patient does not speak negatively about her spouse and is only focused on being able to be with her daughter again. Patient is hopeful about this. She noted there is a custody hearing in Jan. She feels like everyday is one day closer to being with her daughter. Patient wants to be able to provide for her daughter one day and is looking forward to work. Patient denied suicidal ideations currently and previously. Patient noted that when she was hospitalized she had a pain in her chest and she thought something medically was wrong with her. She didn't sleep at all the night prior to going to the hospital and her friend was not available to watch her child. Patient ended up taking her child with her to the hospital but text her former spouse on the way. She showed therapist text  exchange and patient had text her childs father to meet her at the hospital and she wasn't sure if she would be alive. Patient meant she wasn't sure if she was having a medical issue, not that she was suicidal. Patient's intake and discharge information for the hospital also deny suicidal ideations.   Patient engaged in session. Patient responded well to interventions. Patient continues to meet criteria for GAD (generalized anxiety disorder)  Depression with anxiety  Patient will continue in outpatient therapy due to being the least restrictive service to meet her needs. Patient made minimal progress on her goals at this time.     Suicidal/Homicidal:  No without intent/plan   Recent bereavement or loss of a relationship  Protective Factors: positive social support, responsibility to others (children, family), hope for the future, religious beliefs against suicide, and life satisfaction  Plan: Patient will return in 2-4 weeks. Patient will pay attention to thoughts, feelings, and behaviors.   Diagnosis: Axis I: GAD (generalized anxiety disorder)  Depression with anxiety    Collaboration of Care: Other PCP and Lawyer as needed  Patient/Guardian was advised Release of Information must be obtained prior to any record release in order to collaborate their care with an outside provider. Patient/Guardian was advised if they have not already done so to contact the registration department to sign all necessary forms in order for [place] to release information regarding their care.   Consent: Patient/Guardian gives verbal consent for treatment and assignment of benefits for services provided during this visit. Patient/Guardian expressed understanding and agreed to proceed.

## 2023-09-04 ENCOUNTER — Ambulatory Visit (HOSPITAL_COMMUNITY): Payer: 59 | Admitting: Licensed Clinical Social Worker

## 2023-09-18 ENCOUNTER — Telehealth (HOSPITAL_COMMUNITY): Payer: Self-pay | Admitting: Licensed Clinical Social Worker

## 2023-09-18 NOTE — Telephone Encounter (Signed)
 Patient did not receive or was unable to open email with letter from provider attached X 2. Attempting to send via MyChart message.

## 2023-09-22 ENCOUNTER — Ambulatory Visit (HOSPITAL_COMMUNITY): Payer: 59 | Admitting: Licensed Clinical Social Worker

## 2023-10-13 ENCOUNTER — Ambulatory Visit (HOSPITAL_COMMUNITY): Payer: 59 | Admitting: Licensed Clinical Social Worker

## 2023-10-14 ENCOUNTER — Ambulatory Visit (INDEPENDENT_AMBULATORY_CARE_PROVIDER_SITE_OTHER): Payer: 59 | Admitting: Licensed Clinical Social Worker

## 2023-10-14 DIAGNOSIS — F411 Generalized anxiety disorder: Secondary | ICD-10-CM | POA: Diagnosis not present

## 2023-10-14 NOTE — Progress Notes (Addendum)
 THERAPIST PROGRESS NOTE  Session Time:  3:00 pm-3:45 pm  Type of Therapy: Individual Therapy  Purpose of session/Treatment Goals addressed: Anet will manage mood and anxiety as evidenced by returning to a previous level of functioning, increase zest for life, increase hope for future, and manage depressive and anxious thoughts for 5 out of 7 days for 60 days.   Interventions: Therapist utilized CBT, ACT, and Solution focused  brief therapy  to address mood and anxiety . Therapist provided support and empathy to patient during session. Therapist worked with patient on increasing hope for the future.   Effectiveness: Patient was oriented x4 (person, place, situation, and time) . Patient was  Appropriate. Patient was Well Groomed.  Patient noted that she was granted supervised visits and is looking forward to see her daughter. Patient became tearful at the thought of her daughter forgetting her. Patient shared the court document and pointed out where it stated that CPS noted that she suicidal. Upon review of her admission and discharge information for the hospital, no mention of being suicidal was mention. 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 Patients record noted that she had chest pain for up to two days and patient admits that she had reduced sleep due to the pain in her chest. She also noted that the interpreter that spoke to her parents misunderstood her parents. Patient showed where it is documented that her parents said they would welcome her back to [place] but not her child. Patient asked her parents about this and they were clueless about this. She was hurt that this was in her record. Patient feels like she is in the [place] , no family, no ability to work, no financial support from her former spouse, but she is remaining hopeful. She is worried financially  right now due to not working and not sure if her former spouse is going to continue to allow her to live in the home since it is in his name.   Patient engaged in session. Patient responded well to interventions. Patient continues to meet criteria for GAD (generalized anxiety disorder)  Patient will continue in outpatient therapy due to being the least restrictive service to meet her needs. Patient made minimal progress on her goals at this time.     Suicidal/Homicidal:  No without intent/plan   Recent bereavement or loss of a relationship  Protective Factors: positive social support, responsibility to others (children, family), hope for the future, religious beliefs against suicide, and life satisfaction  Plan: Patient will return in 2-4 weeks. Patient will pay attention to thoughts, feelings, and behaviors.   Diagnosis: Axis I: GAD (generalized anxiety disorder)    Collaboration of Care: Other PCP and Lawyer as needed  Patient/Guardian was advised Release of Information must be obtained prior to any record release in order to collaborate their care with an outside provider. Patient/Guardian was advised if they have not already done so to contact the registration department to sign all necessary forms in order for us  to release information regarding their care.   Consent: Patient/Guardian gives verbal consent for treatment and assignment of benefits for services provided during this visit. Patient/Guardian expressed understanding and agreed to proceed.

## 2023-10-28 ENCOUNTER — Ambulatory Visit (INDEPENDENT_AMBULATORY_CARE_PROVIDER_SITE_OTHER): Payer: Medicaid Other | Admitting: Licensed Clinical Social Worker

## 2023-10-28 DIAGNOSIS — F411 Generalized anxiety disorder: Secondary | ICD-10-CM

## 2023-10-29 NOTE — Progress Notes (Addendum)
 THERAPIST PROGRESS NOTE  Session Time:  1:00 pm-1:45 pm  Type of Therapy: Individual Therapy  Purpose of session/Treatment Goals addressed: Aleli will manage mood and anxiety as evidenced by returning to a previous level of functioning, increase zest for life, increase hope for future, and manage depressive and anxious thoughts for 5 out of 7 days for 60 days.   Interventions: Therapist utilized CBT, ACT, and Solution focused  brief therapy  to address mood and anxiety . Therapist provided support and empathy to patient during session.   Therapist worked with patient on increasing hope for the future.   Effectiveness: Patient was oriented x4 (person, place, situation, and time) . Patient was  Appropriate. Patient was Well Groomed. Patient was able to see her daughter. She noted her daughter clung to her but was tearful. The visit lasted about half an hour. She was so happy to see her daughter. She is looking forward to her next visit with her daughter and is focused on doing what she needs to do to be with her daughter. Patient is trying to communicate with her ex is a calm,  neutral way. She doesn't want to upset him and/or come across too commanding. Patient is only asking about how her daughter is doing with communicating with her ex. Patient got a new lawyer and see feels like she is more confident. Patient also heard from CPS and was told that a CPS was open but now closed. She didn't know a case was opened. She feels like CPS is going to write a letter on her behalf.   Patient engaged in session. Patient responded well to interventions. Patient continues to meet criteria for GAD (generalized anxiety disorder)  Patient will continue in outpatient therapy due to being the least restrictive service to meet her needs. Patient made minimal progress on her goals at this time.     Suicidal/Homicidal:  No without intent/plan   Recent bereavement or loss of a relationship  Protective Factors:  positive social support, responsibility to others (children, family), hope for the future, religious beliefs against suicide, and life satisfaction  Plan: Patient will return in 2-4 weeks.   Diagnosis: Axis I: GAD (generalized anxiety disorder)    Collaboration of Care: Other PCP and Lawyer as needed  Patient/Guardian was advised Release of Information must be obtained prior to any record release in order to collaborate their care with an outside provider. Patient/Guardian was advised if they have not already done so to contact the registration department to sign all necessary forms in order for us  to release information regarding their care.   Consent: Patient/Guardian gives verbal consent for treatment and assignment of benefits for services provided during this visit. Patient/Guardian expressed understanding and agreed to proceed.

## 2023-10-30 ENCOUNTER — Telehealth: Payer: Self-pay | Admitting: Family Medicine

## 2023-10-30 NOTE — Telephone Encounter (Signed)
Pt came in requesting records to be released to her attorney. Papers have been faxed successfully  to release records. Pt stated she will call back if records not received in 7 days. Pt was given my name for her records and her attorneys pt may call and ask to speak with mr regarding further info. Please transfer pt through to office if she asks for me.

## 2023-11-11 ENCOUNTER — Other Ambulatory Visit (HOSPITAL_COMMUNITY): Payer: Self-pay | Admitting: Psychiatry

## 2023-11-18 ENCOUNTER — Ambulatory Visit (INDEPENDENT_AMBULATORY_CARE_PROVIDER_SITE_OTHER): Admitting: Licensed Clinical Social Worker

## 2023-11-18 DIAGNOSIS — F411 Generalized anxiety disorder: Secondary | ICD-10-CM

## 2023-11-18 NOTE — Progress Notes (Addendum)
 THERAPIST PROGRESS NOTE  Session Time: 2:10 pm-2:50 am  Type of Therapy: Individual Therapy  Purpose of session/Treatment Goals addressed: Annajulia will manage mood and anxiety as evidenced by returning to a previous level of functioning, increase zest for life, increase hope for future, and manage depressive and anxious thoughts for 5 out of 7 days for 60 days.   Interventions: Therapist utilized CBT, ACT, and Solution focused  brief therapy  to address mood and anxiety . Therapist provided support and empathy to patient during session.  Therapist worked with patient to return to a previous level of functioning.   Effectiveness: Patient was oriented x4 (person, place, situation, and time) . Patient was  Appropriate. Patient was Well Groomed. Patient completed a PHQ9 and GAD7. Patient noted that she has been feeling a variety of feelings. She has felt down at times due to missing her daughter. She has accepted there are things beyond her control. Patient is worried that some of the records from the hospital are going to impact her custody with her daughter. She is remaining hopeful and faithful to God that things are going to work out for her. Patient shared her experience seeing her daughter recently. She was able to hold her and her daughter fell asleep in her arms. She was happy about this. Patient wonders why some of the things have happened to her such as getting hospitalized, getting her daughter removed from her custody, etc. Patient is doing things she needs to do: volunteer, taking care of herself, meditating, and showing up to her visits for her daughter.   Patient engaged in session. Patient responded well to interventions. Patient continues to meet criteria for GAD (generalized anxiety disorder)  Patient will continue in outpatient therapy due to being the least restrictive service to meet her needs. Patient made minimal progress on her goals at this time.     Suicidal/Homicidal:  No without  intent/plan   Recent bereavement or loss of a relationship  Protective Factors: positive social support, responsibility to others (children, family), hope for the future, religious beliefs against suicide, and life satisfaction  Plan: Patient will return in 2-4 weeks.   Diagnosis: Axis I: GAD (generalized anxiety disorder)    Collaboration of Care: Other PCP and Lawyer as needed  Patient/Guardian was advised Release of Information must be obtained prior to any record release in order to collaborate their care with an outside provider. Patient/Guardian was advised if they have not already done so to contact the registration department to sign all necessary forms in order for us  to release information regarding their care.   Consent: Patient/Guardian gives verbal consent for treatment and assignment of benefits for services provided during this visit. Patient/Guardian expressed understanding and agreed to proceed.

## 2023-12-08 ENCOUNTER — Ambulatory Visit (INDEPENDENT_AMBULATORY_CARE_PROVIDER_SITE_OTHER): Payer: Medicaid Other | Admitting: Licensed Clinical Social Worker

## 2023-12-08 DIAGNOSIS — F411 Generalized anxiety disorder: Secondary | ICD-10-CM

## 2023-12-08 NOTE — Progress Notes (Addendum)
 THERAPIST PROGRESS NOTE  Session Time: 3:26 pm-3:55 pm  Type of Therapy: Individual Therapy  Purpose of session/Treatment Goals addressed: Angel [name] will manage mood and anxiety as evidenced by returning to a previous level of functioning, increase zest for life, increase hope for future, and manage depressive and anxious thoughts for 5 out of 7 days for 60 days.   Interventions: Therapist utilized CBT, ACT, and Solution focused  brief therapy  to address mood and anxiety . Therapist provided support and empathy to patient during session.  Therapist worked with patient on increasing zest for life.   Effectiveness: Patient was oriented x4 (person, place, situation, and time) . Patient was  Appropriate. Patient was Well Groomed.  Patient reported that she was late to visit and was unable to have it. She noted she got her period and had back pains. She then had difficulty finding parking which caused her to show up 10 mins late. She was not allowed to have the visit. This was a new location for visitation. Patient is trying to focus on herself while she is working on getting her daughter back. 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 She has been focusing on worshipping at temple on a regular basis and feels a little better when she goes.   Patient engaged in session. Patient responded well to interventions. Patient continues to meet criteria for GAD (generalized anxiety disorder)  Patient will continue in outpatient therapy due to being the least restrictive service to meet her needs. Patient made minimal progress on her goals at this time.     Suicidal/Homicidal:  No without intent/plan   Recent bereavement or loss of a relationship  Protective Factors: positive social support, responsibility to others (children, family), hope for the future, religious beliefs against suicide, and life satisfaction  Plan: Patient will return in 2-4 weeks.    Diagnosis: Axis I: GAD (generalized anxiety disorder)    Collaboration of Care: Other PCP and Lawyer as needed  Patient/Guardian was advised Release of Information must be obtained prior to any record release in order to collaborate their care with an outside provider. Patient/Guardian was advised if they have not already done so to contact the registration department to sign all necessary forms in order for [place] to release information regarding their care.   Consent: Patient/Guardian gives verbal consent for treatment and assignment of benefits for services provided during this visit. Patient/Guardian expressed understanding and agreed to proceed.

## 2023-12-31 ENCOUNTER — Ambulatory Visit (INDEPENDENT_AMBULATORY_CARE_PROVIDER_SITE_OTHER): Admitting: Licensed Clinical Social Worker

## 2023-12-31 DIAGNOSIS — F411 Generalized anxiety disorder: Secondary | ICD-10-CM

## 2023-12-31 NOTE — Progress Notes (Addendum)
 THERAPIST PROGRESS NOTE  Session Time: 4:00 pm-4:55 pm  Type of Therapy: Individual Therapy  Purpose of session/Treatment Goals addressed: Sharetha will manage mood and anxiety as evidenced by returning to a previous level of functioning, increase zest for life, increase hope for future, and manage depressive and anxious thoughts for 5 out of 7 days for 60 days.   Interventions: Therapist utilized CBT, ACT, and Solution focused  brief therapy  to address mood and anxiety . Therapist provided support and empathy to patient during session.  Therapist worked with patient on returning to a previous level of functioning.   Effectiveness: Patient was oriented x4 (person, place, situation, and time) . Patient was  Appropriate. Patient was Well Groomed.  Patient completed the PHQ9 and GAD7. Patient got to see her daughter again and had a great visit. She was so happy about this. She is looking forward to the next visit. Patient has been functioning well. She continues to put things in God's hands and do everything she is suppose to. She is not depressed or anxious like she used to be. She still wants to see her daughter and have custody but she is remaining hopeful.   Patient engaged in session. Patient responded well to interventions. Patient continues to meet criteria for GAD (generalized anxiety disorder)  Patient will continue in outpatient therapy due to being the least restrictive service to meet her needs. Patient made minimal progress on her goals at this time.       12/31/2023    4:56 PM 07/18/2023    8:51 AM 07/09/2023   11:27 AM 02/20/2023   11:52 AM 03/05/2022    9:46 AM  Depression screen PHQ 2/9  Decreased Interest 0 1 1 0   Down, Depressed, Hopeless 0 1 1 1  0  PHQ - 2 Score 0 2 2 1  0  Altered sleeping  1 1 1  0  Tired, decreased energy  1 2 1 2   Change in appetite  1 0 1 1  Feeling bad or failure about yourself   1 2 1 3   Trouble concentrating  1 2 0 1  Moving slowly or fidgety/restless   0 1 1 1   Suicidal thoughts  0 0 0 0  PHQ-9 Score  7 10 6 8   Difficult doing work/chores  Somewhat difficult Somewhat difficult         12/31/2023    4:56 PM 02/20/2023   11:53 AM 03/05/2022    9:47 AM 09/20/2021    1:30 PM  GAD 7 : Generalized Anxiety Score  Nervous, Anxious, on Edge 0 1 2 1   Control/stop worrying 0 1 1 1   Worry too much - different things 0 1 1 1   Trouble relaxing 0 1 1 1   Restless 0 2 1 0  Easily annoyed or irritable 0 0 1 1  Afraid - awful might happen 0 1 3 2   Total GAD 7 Score 0 7 10 7   Anxiety Difficulty Not difficult at all         Suicidal/Homicidal:  No without intent/plan   Recent bereavement or loss of a relationship  Protective Factors: positive social support, responsibility to others (children, family), hope for the future, religious beliefs against suicide, and life satisfaction  Plan: Patient will return in 2-4 weeks.   Diagnosis: Axis I: GAD (generalized anxiety disorder)    Collaboration of Care: Other PCP and Lawyer as needed  Patient/Guardian was advised Release of Information must be obtained prior to any record  release in order to collaborate their care with an outside provider. Patient/Guardian was advised if they have not already done so to contact the registration department to sign all necessary forms in order for us  to release information regarding their care.   Consent: Patient/Guardian gives verbal consent for treatment and assignment of benefits for services provided during this visit. Patient/Guardian expressed understanding and agreed to proceed.

## 2024-01-21 ENCOUNTER — Ambulatory Visit (INDEPENDENT_AMBULATORY_CARE_PROVIDER_SITE_OTHER): Admitting: Licensed Clinical Social Worker

## 2024-01-21 DIAGNOSIS — F4322 Adjustment disorder with anxiety: Secondary | ICD-10-CM | POA: Diagnosis not present
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
## 2024-03-01 ENCOUNTER — Ambulatory Visit (HOSPITAL_COMMUNITY): Admitting: Licensed Clinical Social Worker

## 2024-03-01 ENCOUNTER — Encounter (HOSPITAL_COMMUNITY): Payer: Self-pay

## 2024-03-31 ENCOUNTER — Ambulatory Visit (INDEPENDENT_AMBULATORY_CARE_PROVIDER_SITE_OTHER): Admitting: Licensed Clinical Social Worker

## 2024-03-31 DIAGNOSIS — F4322 Adjustment disorder with anxiety: Secondary | ICD-10-CM | POA: Diagnosis not present

## 2024-03-31 NOTE — Progress Notes (Signed)
 THERAPIST PROGRESS NOTE  Session Time: 8:02 am-8:45 am  Type of Therapy: Individual Therapy  Purpose of session/Treatment Goals addressed: Loren will manage mood and anxiety as evidenced by returning to a previous level of functioning, increase zest for life, increase hope for future, and manage depressive and anxious thoughts for 5 out of 7 days for 60 days.   Interventions: Therapist utilized CBT, ACT, and Solution focused  brief therapy  to address mood and anxiety . Therapist provided support and empathy to patient during session.  Therapist worked with patient on increasing zest for life.   Effectiveness: Patient was oriented x4 (person, place, situation, and time) . Patient was  Appropriate. Patient was Well Groomed.  Patient completed the PHQ9 and GAD7. Patient requested a letter concerning her treatment and it will be completed post session. Patient has been trying to go see her daughter and participate in her doctors appointments per the court order. She was not allowed to see or speak with the her doctors. She has been seeing her daughter and the visits are going well. Patient denies anxiety and depression. Her mood is stable and she is hopeful.    Patient engaged in session. Patient responded well to interventions. Patient continues to meet criteria for Adjustment disorder with anxious mood  Patient will continue in outpatient therapy due to being the least restrictive service to meet her needs. Patient made moderate progress on her goals at this time.       03/31/2024    9:00 AM 12/31/2023    4:56 PM 07/18/2023    8:51 AM 07/09/2023   11:27 AM 02/20/2023   11:52 AM  Depression screen PHQ 2/9  Decreased Interest 0 0 1 1 0  Down, Depressed, Hopeless 0 0 1 1 1   PHQ - 2 Score 0 0 2 2 1   Altered sleeping   1 1 1   Tired, decreased energy   1 2 1   Change in appetite   1 0 1  Feeling bad or failure about yourself    1 2 1   Trouble concentrating   1 2 0  Moving slowly or fidgety/restless    0 1 1  Suicidal thoughts   0 0 0  PHQ-9 Score   7 10 6   Difficult doing work/chores   Somewhat difficult Somewhat difficult        03/31/2024    8:59 AM 12/31/2023    4:56 PM 02/20/2023   11:53 AM 03/05/2022    9:47 AM  GAD 7 : Generalized Anxiety Score  Nervous, Anxious, on Edge 0 0 1 2  Control/stop worrying 0 0 1 1  Worry too much - different things 0 0 1 1  Trouble relaxing 0 0 1 1  Restless 0 0 2 1  Easily annoyed or irritable 0 0 0 1  Afraid - awful might happen 0 0 1 3  Total GAD 7 Score 0 0 7 10  Anxiety Difficulty Not difficult at all Not difficult at all        Suicidal/Homicidal:  No without intent/plan   Recent bereavement or loss of a relationship  Protective Factors: positive social support, responsibility to others (children, family), hope for the future, religious beliefs against suicide, and life satisfaction  Plan: Patient will return in 2-4 weeks.   Diagnosis: Axis I: Adjustment disorder with anxious mood    Collaboration of Care: Other PCP and Lawyer as needed  Patient/Guardian was advised Release of Information must be obtained prior to any  record release in order to collaborate their care with an outside provider. Patient/Guardian was advised if they have not already done so to contact the registration department to sign all necessary forms in order for us  to release information regarding their care.   Consent: Patient/Guardian gives verbal consent for treatment and assignment of benefits for services provided during this visit. Patient/Guardian expressed understanding and agreed to proceed.

## 2024-05-06 ENCOUNTER — Ambulatory Visit (INDEPENDENT_AMBULATORY_CARE_PROVIDER_SITE_OTHER): Admitting: Licensed Clinical Social Worker

## 2024-05-06 DIAGNOSIS — F4322 Adjustment disorder with anxiety: Secondary | ICD-10-CM

## 2024-05-06 NOTE — Progress Notes (Signed)
 THERAPIST PROGRESS NOTE  Session Time: 8:02 am-8:45 am  Type of Therapy: Individual Therapy  Treatment Goals: Bryce will manage mood and anxiety as evidenced by returning to a previous level of functioning, increase zest for life, increase hope for future, and manage depressive and anxious thoughts for 5 out of 7 days for 60 days.   Session Goal: Hope for future, Anxious thoughts  Behavior: Patient went to court and was awarded graduated visitation. She had her daughter overnight and she will have her daughter for weekend. She wants to have consistent visitation with her daughter, and consistent communication with her daughter's father about their daughter. She has not got access to information to her daughter's daycare as outlined in the parental agreement. She is trying to get access but her former spouse and the daycare did not give her information. Patient doesn't eat meat but feels like her daughter does. She doesn't keep meat in her home but is willing to let her daughter eat meat if she knows what she is eating. Patient was oriented x4 (person, place, situation, and time) . Patient was  Appropriate. Patient was Well Groomed. Patient made moderate progress on her goals at this time  Interventions: Therapist used CBT cognitive restructuring and focusing on the present through controlling what she can control.   Response: Patient is worried about not getting information from her former spouse related to her daughter. She is also worried about not getting her permission to work in the [place] . She doesn't know how much more suffering she can endure before giving up and going back to [place]. Patient doesn't want to do that. She has consider how many years left she has if she wanted to have more children. Patient is willing to focus on what is going well and focus on what she can control now.  Patient engaged in session. Patient responded well to interventions. Patient continues to meet criteria for  Adjustment disorder with anxious mood  Patient will continue in outpatient therapy due to being the least restrictive service to meet her needs. .   Plan: Patient will return in 2-4 weeks. Patient will focus on the present. She will continue to reach out to daycare to get information as well as communicate with her former spouse about their daughter.       03/31/2024    9:00 AM 12/31/2023    4:56 PM 07/18/2023    8:51 AM 07/09/2023   11:27 AM 02/20/2023   11:52 AM  Depression screen PHQ 2/9  Decreased Interest 0 0 1 1 0  Down, Depressed, Hopeless 0 0 1 1 1   PHQ - 2 Score 0 0 2 2 1   Altered sleeping   1 1 1   Tired, decreased energy   1 2 1   Change in appetite   1 0 1  Feeling bad or failure about yourself    1 2 1   Trouble concentrating   1 2 0  Moving slowly or fidgety/restless   0 1 1  Suicidal thoughts   0 0 0  PHQ-9 Score   7 10 6   Difficult doing work/chores   Somewhat difficult Somewhat difficult        03/31/2024    8:59 AM 12/31/2023    4:56 PM 02/20/2023   11:53 AM 03/05/2022    9:47 AM  GAD 7 : Generalized Anxiety Score  Nervous, Anxious, on Edge 0 0 1 2  Control/stop worrying 0 0 1 1  Worry too much - different things  0 0 1 1  Trouble relaxing 0 0 1 1  Restless 0 0 2 1  Easily annoyed or irritable 0 0 0 1  Afraid - awful might happen 0 0 1 3  Total GAD 7 Score 0 0 7 10  Anxiety Difficulty Not difficult at all Not difficult at all        Suicidal/Homicidal:  No without intent/plan   Recent bereavement or loss of a relationship  Protective Factors: positive social support, responsibility to others (children, family), hope for the future, religious beliefs against suicide, and life satisfaction    Diagnosis: Axis I: Adjustment disorder with anxious mood    Collaboration of Care: Other PCP and Lawyer as needed  Patient/Guardian was advised Release of Information must be obtained prior to any record release in order to collaborate their care with an outside provider.  Patient/Guardian was advised if they have not already done so to contact the registration department to sign all necessary forms in order for us  to release information regarding their care.   Consent: Patient/Guardian gives verbal consent for treatment and assignment of benefits for services provided during this visit. Patient/Guardian expressed understanding and agreed to proceed.

## 2024-06-08 ENCOUNTER — Ambulatory Visit (HOSPITAL_COMMUNITY): Admitting: Licensed Clinical Social Worker

## 2024-06-08 DIAGNOSIS — F4322 Adjustment disorder with anxiety: Secondary | ICD-10-CM | POA: Diagnosis not present
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
## 2024-06-21 ENCOUNTER — Ambulatory Visit (INDEPENDENT_AMBULATORY_CARE_PROVIDER_SITE_OTHER): Admitting: Licensed Clinical Social Worker

## 2024-06-21 DIAGNOSIS — F4322 Adjustment disorder with anxiety: Secondary | ICD-10-CM | POA: Diagnosis not present

## 2024-06-21 NOTE — Progress Notes (Signed)
 THERAPIST PROGRESS NOTE  Session Time: 1:01 pm-1:59 pm  Type of Therapy: Individual Therapy  Treatment Goals: Angel [name] will manage mood and anxiety as evidenced by returning to a previous level of functioning, increase zest for life, increase hope for future, and manage depressive and anxious thoughts for 5 out of 7 days for 60 days.   Session Goal: zest for life  Behavior: Patient noted that she has court coming up tomorrow and her mood as well as her anxiety are stable. Patient has been able to see her daughter. She also met with her lawyer who instructed her to not move out of the area because it could impact custody. Patient was oriented x4 (person, place, situation, and time) . Patient was  Appropriate. Patient was Well Groomed. Patient made moderate progress on her goals at this time  Interventions: Therapist used CBT interventions of identifying values to regulate anxiety.   Response: Patient has faith in God, prays, and is focusing on being the best version of herself for her daughter. She is waiting for immigration/visa approval, for her work approval, and to look for a place to live. She is also going to start mediation with her former spouse and will have to split the cost. She is focused on the now and controlling what she can control. She is willing to accept what happens and make the best of it. She values faith in God, acceptance, and her daughter. This has helped regulate her mood.   Patient engaged in session. Patient responded well to interventions. Patient continues to meet criteria for Adjustment disorder with anxious mood  Patient will continue in outpatient therapy due to being the least restrictive service to meet her needs.   Plan: Patient will return in 2-4 weeks. Patient will continue to accept what she can't control, and control what is within her control.   Suicidal/Homicidal:  No without intent/plan   Recent bereavement or loss of a relationship  Protective Factors:  positive social support, responsibility to others (children, family), hope for the future, religious beliefs against suicide, and life satisfaction  Collaboration of Care: Other PCP and Lawyer as needed      03/31/2024    9:00 AM 12/31/2023    4:56 PM 07/18/2023    8:51 AM 07/09/2023   11:27 AM 02/20/2023   11:52 AM  Depression screen PHQ 2/9  Decreased Interest 0 0 1 1 0  Down, Depressed, Hopeless 0 0 1 1 1   PHQ - 2 Score 0 0 2 2 1   Altered sleeping   1 1 1   Tired, decreased energy   1 2 1   Change in appetite   1 0 1  Feeling bad or failure about yourself    1 2 1   Trouble concentrating   1 2 0  Moving slowly or fidgety/restless   0 1 1  Suicidal thoughts   0 0 0  PHQ-9 Score   7 10 6   Difficult doing work/chores   Somewhat difficult Somewhat difficult        03/31/2024    8:59 AM 12/31/2023    4:56 PM 02/20/2023   11:53 AM 03/05/2022    9:47 AM  GAD 7 : Generalized Anxiety Score  Nervous, Anxious, on Edge 0 0 1 2  Control/stop worrying 0 0 1 1  Worry too much - different things 0 0 1 1  Trouble relaxing 0 0 1 1  Restless 0 0 2 1  Easily annoyed or irritable 0 0 0 1  Afraid - awful might happen 0 0 1 3  Total GAD 7 Score 0 0 7 10  Anxiety Difficulty Not difficult at all Not difficult at all       Patient/Guardian was advised Release of Information must be obtained prior to any record release in order to collaborate their care with an outside provider. Patient/Guardian was advised if they have not already done so to contact the registration department to sign all necessary forms in order for us  to release information regarding their care.   Consent: Patient/Guardian gives verbal consent for treatment and assignment of benefits for services provided during this visit. Patient/Guardian expressed understanding and agreed to proceed.

## 2024-07-14 ENCOUNTER — Ambulatory Visit (HOSPITAL_COMMUNITY): Admitting: Licensed Clinical Social Worker

## 2024-07-14 DIAGNOSIS — F4322 Adjustment disorder with anxiety: Secondary | ICD-10-CM
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
## 2024-08-16 ENCOUNTER — Ambulatory Visit (HOSPITAL_COMMUNITY): Admitting: Licensed Clinical Social Worker

## 2024-08-16 DIAGNOSIS — F4322 Adjustment disorder with anxiety: Secondary | ICD-10-CM

## 2024-08-16 NOTE — Progress Notes (Unsigned)
 THERAPIST PROGRESS NOTE  Session Time: 3:11 pm-3:50 pm  Type of Therapy: Individual Therapy  Treatment Goals: Evadene will manage mood and anxiety as evidenced by returning to a previous level of functioning, increase zest for life, increase hope for future, and manage depressive and anxious thoughts for 5 out of 7 days for 60 days.   Session Goal: Arra will identify cognitive patterns and beliefs that support depression and/or anxiety  Behavior: Patient noted that she had to move out of her home. For a moment she didn't know what she was going to do and was stressed as well as feeling down. Patient then felt relief and recognized how blessed she has been. A friend offered her a place to live in [place], [place] so she had a place to live and her daughter could come for visits. Patient has not been approved for a work visa yet and she continues to wait for this.  Patient was oriented x4 (person, place, situation, and time) . Patient was  Appropriate. Patient was Well Groomed. Patient made moderate progress on her goals at this time  Interventions: Therapist used CBT interventions of cognitions that lead to feeling anxiety or depressive symptoms.   Response: Patient has tried to focus on finding the silver lining to her concerns. While she had to leave the home she has lived in for years she views this as a way as a renewal and/or fresh start. Patient wants to be in her daughter's life and make her a good citizen whether patient stays in [place]  or goes back to [place]. She noted that while she has struggled, God has sent her angels when she needed help and she is grateful for her growth and for what she has lived through.   Patient engaged in session. Patient responded well to interventions. Patient continues to meet criteria for Adjustment disorder with anxious mood  Patient will continue in outpatient therapy due to being the least restrictive service to meet her needs.   Plan: Patient will return  in 2-4 weeks. Patient will continue to focus on what she has or her strength she has gained.   Suicidal/Homicidal:  No without intent/plan   Recent bereavement or loss of a relationship  Protective Factors: positive social support, responsibility to others (children, family), hope for the future, religious beliefs against suicide, and life satisfaction  Collaboration of Care: Other PCP and Lawyer as needed      03/31/2024    9:00 AM 12/31/2023    4:56 PM 07/18/2023    8:51 AM 07/09/2023   11:27 AM 02/20/2023   11:52 AM  Depression screen PHQ 2/9  Decreased Interest 0 0 1 1 0  Down, Depressed, Hopeless 0 0 1 1 1   PHQ - 2 Score 0 0 2 2 1   Altered sleeping   1 1 1   Tired, decreased energy   1 2 1   Change in appetite   1 0 1  Feeling bad or failure about yourself    1 2 1   Trouble concentrating   1 2 0  Moving slowly or fidgety/restless   0 1 1  Suicidal thoughts   0 0 0  PHQ-9 Score   7  10  6    Difficult doing work/chores   Somewhat difficult Somewhat difficult      Data saved with a previous flowsheet row definition       03/31/2024    8:59 AM 12/31/2023    4:56 PM 02/20/2023   11:53 AM  03/05/2022    9:47 AM  GAD 7 : Generalized Anxiety Score  Nervous, Anxious, on Edge 0 0 1 2  Control/stop worrying 0 0 1 1  Worry too much - different things 0 0 1 1  Trouble relaxing 0 0 1 1  Restless 0 0 2 1  Easily annoyed or irritable 0 0 0 1  Afraid - awful might happen 0 0 1 3  Total GAD 7 Score 0 0 7 10  Anxiety Difficulty Not difficult at all Not difficult at all       Patient/Guardian was advised Release of Information must be obtained prior to any record release in order to collaborate their care with an outside provider. Patient/Guardian was advised if they have not already done so to contact the registration department to sign all necessary forms in order for us  to release information regarding their care.   Consent: Patient/Guardian gives verbal consent for treatment and assignment  of benefits for services provided during this visit. Patient/Guardian expressed understanding and agreed to proceed.

## 2024-09-23 ENCOUNTER — Ambulatory Visit (HOSPITAL_COMMUNITY): Admitting: Licensed Clinical Social Worker

## 2024-10-07 ENCOUNTER — Ambulatory Visit (INDEPENDENT_AMBULATORY_CARE_PROVIDER_SITE_OTHER): Admitting: Licensed Clinical Social Worker

## 2024-10-07 DIAGNOSIS — F4322 Adjustment disorder with anxiety: Secondary | ICD-10-CM

## 2024-10-08 ENCOUNTER — Encounter (HOSPITAL_COMMUNITY): Payer: Self-pay | Admitting: Licensed Clinical Social Worker
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
## 2024-10-14 ENCOUNTER — Ambulatory Visit (HOSPITAL_COMMUNITY): Admitting: Licensed Clinical Social Worker

## 2024-11-04 ENCOUNTER — Ambulatory Visit (HOSPITAL_COMMUNITY): Admitting: Licensed Clinical Social Worker
